# Patient Record
Sex: Male | Born: 1950 | Race: Black or African American | Hispanic: No | Smoking: Never smoker
Health system: Southern US, Community
[De-identification: ages and names within clinical notes are randomized; demographics above are authoritative.]

## PROBLEM LIST (undated history)

## (undated) DIAGNOSIS — K219 Gastro-esophageal reflux disease without esophagitis: Secondary | ICD-10-CM

## (undated) DIAGNOSIS — G4730 Sleep apnea, unspecified: Secondary | ICD-10-CM

## (undated) DIAGNOSIS — M1990 Unspecified osteoarthritis, unspecified site: Secondary | ICD-10-CM

## (undated) DIAGNOSIS — E119 Type 2 diabetes mellitus without complications: Secondary | ICD-10-CM

## (undated) DIAGNOSIS — M9979 Connective tissue and disc stenosis of intervertebral foramina of abdomen and other regions: Secondary | ICD-10-CM

## (undated) DIAGNOSIS — I10 Essential (primary) hypertension: Secondary | ICD-10-CM

## (undated) HISTORY — DX: Unspecified osteoarthritis, unspecified site: M19.90

## (undated) HISTORY — DX: Gastro-esophageal reflux disease without esophagitis: K21.9

## (undated) HISTORY — PX: OTHER SURGICAL HISTORY: SHX169

---

## 2008-04-15 ENCOUNTER — Emergency Department: Payer: Self-pay | Admitting: Emergency Medicine

## 2008-11-03 ENCOUNTER — Emergency Department: Payer: Self-pay | Admitting: Emergency Medicine

## 2011-08-27 ENCOUNTER — Emergency Department (HOSPITAL_COMMUNITY)
Admission: EM | Admit: 2011-08-27 | Discharge: 2011-08-27 | Disposition: A | Discharge status: Home / Self Care | Attending: Emergency Medicine | Admitting: Emergency Medicine

## 2011-08-27 ENCOUNTER — Encounter (HOSPITAL_COMMUNITY): Payer: Self-pay | Admitting: Emergency Medicine

## 2011-08-27 DIAGNOSIS — G4730 Sleep apnea, unspecified: Secondary | ICD-10-CM | POA: Insufficient documentation

## 2011-08-27 DIAGNOSIS — T7840XA Allergy, unspecified, initial encounter: Secondary | ICD-10-CM

## 2011-08-27 DIAGNOSIS — R221 Localized swelling, mass and lump, neck: Secondary | ICD-10-CM | POA: Insufficient documentation

## 2011-08-27 DIAGNOSIS — R220 Localized swelling, mass and lump, head: Secondary | ICD-10-CM | POA: Insufficient documentation

## 2011-08-27 DIAGNOSIS — X58XXXA Exposure to other specified factors, initial encounter: Secondary | ICD-10-CM | POA: Insufficient documentation

## 2011-08-27 DIAGNOSIS — Z79899 Other long term (current) drug therapy: Secondary | ICD-10-CM | POA: Insufficient documentation

## 2011-08-27 DIAGNOSIS — I10 Essential (primary) hypertension: Secondary | ICD-10-CM | POA: Insufficient documentation

## 2011-08-27 DIAGNOSIS — T783XXA Angioneurotic edema, initial encounter: Secondary | ICD-10-CM | POA: Insufficient documentation

## 2011-08-27 HISTORY — DX: Essential (primary) hypertension: I10

## 2011-08-27 HISTORY — DX: Sleep apnea, unspecified: G47.30

## 2011-08-27 MED ORDER — METHYLPREDNISOLONE SODIUM SUCC 125 MG IJ SOLR
125.0000 mg | Freq: Once | INTRAMUSCULAR | Status: AC
  Administered 2011-08-27: 125 mg via INTRAVENOUS
  Filled 2011-08-27: qty 2

## 2011-08-27 MED ORDER — PREDNISONE 10 MG PO TABS: 20.0000 mg | ORAL_TABLET | Freq: Two times a day (BID) | ORAL | Status: DC

## 2011-08-27 MED ORDER — FAMOTIDINE IN NACL 20-0.9 MG/50ML-% IV SOLN
20.0000 mg | Freq: Once | INTRAVENOUS | Status: AC
  Administered 2011-08-27: 20 mg via INTRAVENOUS
  Filled 2011-08-27: qty 50

## 2011-08-27 MED ORDER — DIPHENHYDRAMINE HCL 50 MG/ML IJ SOLN
25.0000 mg | Freq: Once | INTRAMUSCULAR | Status: AC
  Administered 2011-08-27: 25 mg via INTRAVENOUS
  Filled 2011-08-27 (×2): qty 1

## 2011-08-27 NOTE — Discharge Instructions (Signed)
Prednisone as prescribed for the next three days.  Benadryl 25 mg every six hours for the next three days.  Stop your Fosinopril.  Follow up with your doctor in one week.      Angioedema Angioedema (AE) is sudden puffiness (swelling) of the skin. It can happen to the face, genitals, and other body parts. You may be reacting to something you are sensitive to (allergic reaction). It may have been passed to you from your parents (hereditary), or it may develop by itself (acquired). You may also get red itchy patches of skin (hives). Attacks may be mild. Some attacks are life-threatening. Most often, the puffiness happens fast. It often gets better in 24 to 48 hours.  HOME CARE  Carry your emergency allergy medicines with you.   Wear a medical bracelet.   Avoid things that you know will cause this reaction (triggers).  GET HELP RIGHT AWAY IF:   You have trouble breathing.   You have trouble swallowing.   You pass out (faint).   You have another attack.   Your attacks happen more often or get worse.   AE was passed to you by your parents and you want to start having children.  MAKE SURE YOU:   Understand these instructions.   Will watch your condition.   Will get help right away if you are not doing well or get worse.  Document Released: 06/13/2009 Document Revised: 03/07/2011 Document Reviewed: 06/13/2009 [facility] Patient Information 2012 [address], [place].

## 2011-08-27 NOTE — ED Provider Notes (Signed)
History     CSN: [id number]  Arrival date & time 08/27/11  1523   First MD Initiated Contact with Patient 08/27/11 1615      Chief Complaint  Patient presents with  . Allergic Reaction    (Consider location/radiation/quality/duration/timing/severity/associated sxs/prior treatment) HPI History by the patient.  61 year old male presenting with complaint of lip swelling. Patient's symptoms began gradually around 1:30 to 2 PM today. Patient states that this was one to 2 hours after eating some seafood and coffee. Patient reports swelling location in the left lower lip without associated tong, throat, or other facial swelling. Patient denies associated bruits change, difficulty swallowing, breathing, rash, or nausea vomiting. Patient has no similar symptoms in the past. Patient has not been seen recently for this or other complaints. Patient does take fosinopril, which he has been on for approximately 6 months with our prior difficulties. Patient was previously on lisinopril but discontinued this secondary to "feeling like he was losing his mind."  Patient did take Benadryl 25 mg his PCP recommendation prior to ED arrival without appreciated change in the symptoms.   Past Medical History  Diagnosis Date  . Hypertension   . Sleep apnea     Past Surgical History  Procedure Date  . Uvula removal     No family history on file. including no known FH of angioedema.  History  Substance Use Topics  . Smoking status: Never Smoker   . Smokeless tobacco: Not on file  . Alcohol Use: No      Review of Systems  Constitutional: Negative for fever and chills.  HENT: Positive for facial swelling. Negative for congestion, sore throat and rhinorrhea.   Eyes: Negative for pain and visual disturbance.  Respiratory: Negative for cough and shortness of breath.   Cardiovascular: Negative for chest pain and palpitations.  Gastrointestinal: Negative for nausea, vomiting, abdominal pain and  diarrhea.  Genitourinary: Negative for dysuria and hematuria.  Musculoskeletal: Negative for back pain and gait problem.  Skin: Negative for rash and wound.  Neurological: Positive for headaches (mild, generalized, worse with light). Negative for dizziness.  Psychiatric/Behavioral: Negative for confusion and agitation.  All other systems reviewed and are negative.    Allergies  Contrast media and Lisinopril  Home Medications   Current Outpatient Rx  Name Route Sig Dispense Refill  . AMLODIPINE BESYLATE 5 MG PO TABS Oral Take 5 mg by mouth daily.    . ASPIRIN EC 81 MG PO TBEC Oral Take 81 mg by mouth daily.    . ATENOLOL 25 MG PO TABS Oral Take 25 mg by mouth daily.    Marland Kitchen DIPHENHYDRAMINE HCL 25 MG PO TABS Oral Take 25 mg by mouth daily as needed. For allergic reaction    . FOSINOPRIL SODIUM 10 MG PO TABS Oral Take 5 mg by mouth daily.    Marland Kitchen OMEPRAZOLE 20 MG PO CPDR Oral Take 20 mg by mouth daily.    Marland Kitchen OVER THE COUNTER MEDICATION Oral Take 2 tablets by mouth 2 (two) times daily. Green Coffee Bean Vitamin    . OVER THE COUNTER MEDICATION Oral Take 2 tablets by mouth 2 (two) times daily. Prostate 5LX    . OVER THE COUNTER MEDICATION Oral Take 1 tablet by mouth daily. Omega XL supplement    . OVER THE COUNTER MEDICATION Oral Take 1 tablet by mouth daily. Male Libido Supplement    . SIMVASTATIN 10 MG PO TABS Oral Take 5 mg by mouth at bedtime.    Marland Kitchen  AZITHROMYCIN 250 MG PO TABS Oral Take 250 mg by mouth daily. Started on 2/13, finished on 2/18      BP 115/71  Pulse 56  Temp(Src) 98 F (36.7 C) (Oral)  Resp 14  SpO2 100%  Physical Exam  Nursing note and vitals reviewed. Constitutional: He is oriented to person, place, and time. No distress.       Min overweight, alert; NL voice/speech; no difficulty swallowing secretions.  HENT:  Head: Normocephalic and atraumatic.  Right Ear: External ear normal.  Left Ear: External ear normal.  Nose: Nose normal.       Lt lower lip edema lateral  to midline; no Sn of tongue or posterior oropharynx edema; absent uvula (with reportedly h/o removal 2/2 OSA).  Eyes: Conjunctivae and EOM are normal. Pupils are equal, round, and reactive to light.  Neck: Normal range of motion. Neck supple.  Cardiovascular: Normal rate, regular rhythm and intact distal pulses.   No murmur heard. Pulmonary/Chest: Effort normal and breath sounds normal. No respiratory distress. He has no wheezes.  Abdominal: Soft. Bowel sounds are normal. He exhibits no distension. There is no tenderness.  Musculoskeletal: Normal range of motion. He exhibits no edema.  Neurological: He is alert and oriented to person, place, and time.  Skin: Skin is warm and dry. No rash noted. He is not diaphoretic.  Psychiatric: He has a normal mood and affect. Judgment normal.    ED Course  Procedures (including critical care time)  Labs Reviewed - No data to display No results found.   1. Allergic reaction   2. Angioedema of lips       MDM  61 year old male presenting with complaints of lip swelling. Patient is taking an ACE inhibitor (x 6 months) without prior similar symptoms in the past.  No other swelling or difficulty breathing.  Exam is above, edema to left lateral lower lip without additional edema to patient's airway. No abnormal bruits or wheezing. Suspect angioedema with possible source of ACE inhibitor. Patient took 25 mg of Benadryl prior to ED arrival.  Plan to observe and to administer steroids, and additional Benadryl, and an H1 blocker.  Patient observed and without worsening of his symptoms. We'll discharge Mr. return precautions and to be 3 days of scheduled Benadryl, steroids, and H1 blockers.      Particia Lather, MD 08/28/11 [phone]

## 2011-08-27 NOTE — ED Notes (Signed)
Pt c/o left lower lip swelling onset approx 1 hr. Ago, pt st's he ate broiled fish at 1:30 today but has never been allergic to fish before.  No resp. Problems noted at present, no rash.

## 2011-08-27 NOTE — ED Notes (Signed)
Pt c/o left lower lip swelling beginning at 1:30. Pt states he thought his lip felt weird and saw it and had a "big blister" on left side. He does not believe that the area has grown since he has seen it. Denies issues swallowing or swelling in throat or tongue. States "my eyes feel weird."

## 2011-08-28 NOTE — ED Provider Notes (Signed)
I saw and evaluated the patient, reviewed the resident's note and I agree with the findings and plan.  I saw the patient along with Dr. Lendell Caprice.  I agree with her assessment, note, and plan.  The patient presented complaining of swelling of the left lower lip.  He is on an ace inhibitor, but denies any other new exposures.  On exam, there is marked swelling of the right side of the lower lip, but does not appear to be any swelling of the throat.  The heart and lung exam was normal, and there was no stridor or respiratory compromise.  He was given IV steroids and benadryl and was observed for two hours.  The swelling improved somewhat.  He will be discharged on prednisone, benadryl, and instructed to stop taking his fosinopril.  He was advised to return if he worsens.  Geoffery Lyons, MD 08/28/11 [phone]

## 2011-10-30 ENCOUNTER — Encounter (HOSPITAL_COMMUNITY): Payer: Self-pay | Admitting: *Deleted

## 2011-10-30 ENCOUNTER — Emergency Department (HOSPITAL_COMMUNITY)
Admission: EM | Admit: 2011-10-30 | Discharge: 2011-10-30 | Disposition: A | Discharge status: Home / Self Care | Attending: Emergency Medicine | Admitting: Emergency Medicine

## 2011-10-30 ENCOUNTER — Emergency Department (HOSPITAL_COMMUNITY)

## 2011-10-30 DIAGNOSIS — I10 Essential (primary) hypertension: Secondary | ICD-10-CM | POA: Insufficient documentation

## 2011-10-30 DIAGNOSIS — R209 Unspecified disturbances of skin sensation: Secondary | ICD-10-CM | POA: Insufficient documentation

## 2011-10-30 DIAGNOSIS — R42 Dizziness and giddiness: Secondary | ICD-10-CM | POA: Insufficient documentation

## 2011-10-30 DIAGNOSIS — G4730 Sleep apnea, unspecified: Secondary | ICD-10-CM | POA: Insufficient documentation

## 2011-10-30 DIAGNOSIS — R51 Headache: Secondary | ICD-10-CM

## 2011-10-30 LAB — COMPREHENSIVE METABOLIC PANEL
ALT: 24 U/L (ref 0–53)
AST: 24 U/L (ref 0–37)
Albumin: 4.3 g/dL (ref 3.5–5.2)
Alkaline Phosphatase: 95 U/L (ref 39–117)
Chloride: 100 mEq/L (ref 96–112)
Potassium: 3.9 mEq/L (ref 3.5–5.1)
Sodium: 139 mEq/L (ref 135–145)
Total Bilirubin: 0.5 mg/dL (ref 0.3–1.2)
Total Protein: 7.4 g/dL (ref 6.0–8.3)

## 2011-10-30 LAB — DIFFERENTIAL
Basophils Absolute: 0 10*3/uL (ref 0.0–0.1)
Basophils Relative: 1 % (ref 0–1)
Eosinophils Absolute: 0.1 10*3/uL (ref 0.0–0.7)
Monocytes Relative: 6 % (ref 3–12)
Neutro Abs: 1.8 10*3/uL (ref 1.7–7.7)
Neutrophils Relative %: 43 % (ref 43–77)

## 2011-10-30 LAB — CBC
Hemoglobin: 15.4 g/dL (ref 13.0–17.0)
MCH: 29.4 pg (ref 26.0–34.0)
MCHC: 34.3 g/dL (ref 30.0–36.0)
Platelets: 166 10*3/uL (ref 150–400)

## 2011-10-30 LAB — CK TOTAL AND CKMB (NOT AT ARMC): Total CK: 389 U/L — ABNORMAL HIGH (ref 7–232)

## 2011-10-30 LAB — TROPONIN I: Troponin I: <0.3 ng/mL (ref <0.30)

## 2011-10-30 NOTE — ED Notes (Signed)
The pt has had a numbness and tingling in the rt side of his face and rt head since yesterday.  He now has a headache today.  He still has some tingling rt face and head with dizziness

## 2011-10-30 NOTE — ED Provider Notes (Signed)
History   This chart was scribed for Micheal Co, MD by Clarita Crane. The patient was seen in room STRE2/STRE2. Patient's care was started at 1643.    CSN: [id number]  Arrival date & time 10/30/11  1643   None     Chief Complaint  Patient presents with  . tingling rt face     HPI Micheal Wood is a 61 y.o. male who presents to the Emergency Department complaining of intermittent episodes of dizziness described as disequilibrium onset this morning and persistent since although gradually improving with associated intermittent HAs. Patient also notes he had a brief episode yesterday in which the right side of his face became suddenly "disfigured" which he describes as a facial droop but upwards. Patient states this episode lasted 10 seconds before resolving on its own.  Additionally, patient relates that he had a 3-5 minute episode yesterday following facial "disfiguring" in which his left hand contracted but the contraction resolved on its own. Denies numbness, tingling, weakness, nausea, vomiting, chest pain, SOB, fever chills. Patient with h/o HTN and sleep apnea.   Past Medical History  Diagnosis Date  . Hypertension   . Sleep apnea     Past Surgical History  Procedure Date  . U     No family history on file.  History  Substance Use Topics  . Smoking status: Never Smoker   . Smokeless tobacco: Not on file  . Alcohol Use: No      Review of Systems  Constitutional: Negative for fever and chills.  HENT:       Right sided facial "disfiguring"  Respiratory: Negative for shortness of breath.   Cardiovascular: Negative for chest pain.  Gastrointestinal: Negative for nausea and vomiting.  Musculoskeletal:       Left hand contraction.   Neurological: Positive for dizziness and headaches. Negative for tremors, weakness and numbness.  All other systems reviewed and are negative.    Allergies  Contrast media; Fosinopril; and Lisinopril  Home Medications   Current  Outpatient Rx  Name Route Sig Dispense Refill  . AMLODIPINE BESYLATE 5 MG PO TABS Oral Take 5 mg by mouth daily.    . ASPIRIN EC 81 MG PO TBEC Oral Take 81 mg by mouth daily.    . ATENOLOL 25 MG PO TABS Oral Take 25 mg by mouth daily.    Marland Kitchen OMEPRAZOLE 20 MG PO CPDR Oral Take 20 mg by mouth daily.    Marland Kitchen OVER THE COUNTER MEDICATION Oral Take 2 tablets by mouth 2 (two) times daily. Green Coffee Bean Vitamin    . OVER THE COUNTER MEDICATION Oral Take 2 tablets by mouth 2 (two) times daily. Prostate 5LX    . OVER THE COUNTER MEDICATION Oral Take 1 tablet by mouth daily. Omega-3 XL supplement    . OVER THE COUNTER MEDICATION Oral Take 1 tablet by mouth daily. Male Libido Supplement    . SIMVASTATIN 10 MG PO TABS Oral Take 5 mg by mouth at bedtime.      BP 126/77  Pulse 66  Temp 97.6 F (36.4 C)  Resp 18  SpO2 98%  Physical Exam  Nursing note and vitals reviewed. Constitutional: He is oriented to person, place, and time. He appears well-developed and well-nourished. No distress.  HENT:  Head: Normocephalic and atraumatic.  Eyes: EOM are normal. Pupils are equal, round, and reactive to light.  Neck: Neck supple. No tracheal deviation present.  Cardiovascular: Normal rate.   Pulmonary/Chest: Effort normal. No respiratory  distress.  Abdominal: Soft. He exhibits no distension.  Musculoskeletal: Normal range of motion. He exhibits no edema.  Neurological: He is alert and oriented to person, place, and time. No sensory deficit.       Upper extremity strength normal and equal bilaterally. No pronator drift noted. Normal finger to nose bilaterally. No facial droop, face is symmetric. Lower extremity strength normal and equal bilaterally.   Skin: Skin is warm and dry.  Psychiatric: He has a normal mood and affect. His behavior is normal.    ED Course  Procedures (including critical care time)  DIAGNOSTIC STUDIES: Oxygen Saturation is 98% on room air, normal by my interpretation.     COORDINATION OF CARE: 6:51PM-Patient informed of current plan for treatment and evaluation and agrees with plan at this time.     Labs Reviewed  DIFFERENTIAL - Abnormal; Notable for the following:    Lymphocytes Relative 47 (*)    All other components within normal limits  COMPREHENSIVE METABOLIC PANEL - Abnormal; Notable for the following:    Creatinine, Ser 1.43 (*)    GFR calc non Af Amer 51 (*)    GFR calc Af Amer 60 (*)    All other components within normal limits  CK TOTAL AND CKMB - Abnormal; Notable for the following:    Total CK 389 (*)    All other components within normal limits  CBC  TROPONIN I   Ct Head Wo Contrast  10/30/2011  *RADIOLOGY REPORT*  Clinical Data:  Headache and dizziness.  Right facial numbness.  CT HEAD WITHOUT CONTRAST  Technique: Contiguous axial images were obtained from the base of the skull through the vertex without contrast  Comparison: None.  Findings:  There is no evidence of intracranial hemorrhage, brain edema, or other signs of acute infarction.  There is no evidence of intracranial mass lesion or mass effect.  No abnormal extraaxial fluid collections are identified.  There is no evidence of hydrocephalus, or other significant intracranial abnormality.  No skull abnormality identified.  IMPRESSION: Negative non-contrast head CT.  Original Report Authenticated By: Danae Orleans, M.D.     1. Headache       MDM  The patient's symptoms do not sound like a stroke.  Unclear why the patient had transient 10-15 second symptoms of his right face.  It doesn't sound like a facial droop.  At this time is at a normal neurologic exam.  He'll continue his 81 mg aspirin and followup with his physicians.  I doubly he needs an MRI.  I don't believe that this truly needs a TIA workup.      I personally performed the services described in this documentation, which was scribed in my presence. The recorded information has been reviewed and considered.       Micheal Co, MD 10/30/11 [phone]

## 2011-10-30 NOTE — ED Notes (Signed)
The pts face is  Symmetrel and he has no arm drift

## 2011-10-30 NOTE — ED Notes (Signed)
Pt st's he had right sided headache with tingling earlier today.  Pt st's no tingling at this time only has headache at top of head.  Pt alert and oriented x's 3.  Skin warm and dry, color appropriate.

## 2013-06-26 ENCOUNTER — Encounter (HOSPITAL_COMMUNITY): Payer: Self-pay | Admitting: Emergency Medicine

## 2013-06-26 ENCOUNTER — Emergency Department (HOSPITAL_COMMUNITY)
Admission: EM | Admit: 2013-06-26 | Discharge: 2013-06-26 | Disposition: A | Discharge status: Home / Self Care | Attending: Emergency Medicine | Admitting: Emergency Medicine

## 2013-06-26 DIAGNOSIS — Z7982 Long term (current) use of aspirin: Secondary | ICD-10-CM | POA: Insufficient documentation

## 2013-06-26 DIAGNOSIS — Z79899 Other long term (current) drug therapy: Secondary | ICD-10-CM | POA: Insufficient documentation

## 2013-06-26 DIAGNOSIS — I10 Essential (primary) hypertension: Secondary | ICD-10-CM | POA: Insufficient documentation

## 2013-06-26 DIAGNOSIS — R51 Headache: Secondary | ICD-10-CM | POA: Insufficient documentation

## 2013-06-26 DIAGNOSIS — Z8739 Personal history of other diseases of the musculoskeletal system and connective tissue: Secondary | ICD-10-CM | POA: Insufficient documentation

## 2013-06-26 HISTORY — DX: Connective tissue and disc stenosis of intervertebral foramina of abdomen and other regions: M99.79

## 2013-06-26 MED ORDER — HYDROCODONE-ACETAMINOPHEN 5-325 MG PO TABS
1.0000 | ORAL_TABLET | Freq: Four times a day (QID) | ORAL | Status: DC | PRN
Start: 2013-06-26 — End: 2015-03-16

## 2013-06-26 MED ORDER — IBUPROFEN 600 MG PO TABS: 600.0000 mg | ORAL_TABLET | Freq: Four times a day (QID) | ORAL | Status: DC | PRN

## 2013-06-26 MED ORDER — METOCLOPRAMIDE HCL 5 MG/ML IJ SOLN
10.0000 mg | Freq: Once | INTRAMUSCULAR | Status: AC
  Administered 2013-06-26: 10 mg via INTRAVENOUS
  Filled 2013-06-26: qty 2

## 2013-06-26 MED ORDER — SODIUM CHLORIDE 0.9 % IV BOLUS (SEPSIS)
1000.0000 mL | Freq: Once | INTRAVENOUS | Status: AC
  Administered 2013-06-26: 1000 mL via INTRAVENOUS

## 2013-06-26 MED ORDER — KETOROLAC TROMETHAMINE 30 MG/ML IJ SOLN
30.0000 mg | Freq: Once | INTRAMUSCULAR | Status: AC
  Administered 2013-06-26: 30 mg via INTRAVENOUS
  Filled 2013-06-26: qty 1

## 2013-06-26 NOTE — ED Notes (Signed)
Alert, NAD, calm, interactive, IVF bolus complete.

## 2013-06-26 NOTE — ED Notes (Addendum)
Patient comes from home, says he started having a "headache" that starts in the back of his head to the top of his head.  The patient said the reason he came in is because he is afraid that with his high blood pressure and sleep apnea this might be a stroke.  GCS=15, he also advised that he has had "tingling" for a month in his right arm and he was told that it was due to narrowing of his vertebrae.  Pain is intermittent rated 8/10.  The patient also said he took benadryl for allergies at 2130 and viagra at 2230.

## 2013-06-26 NOTE — ED Notes (Signed)
Assumed patient care.  Pt A&O x 4.  States pain at base of skull radiates up back of skull and to the top.  Denies any other symptoms.

## 2013-06-26 NOTE — ED Provider Notes (Signed)
CSN: [id number]     Arrival date & time 06/26/13  8295 History   First MD Initiated Contact with Patient 06/26/13 0358     Chief Complaint  Patient presents with  . Headache   (Consider location/radiation/quality/duration/timing/severity/associated sxs/prior Treatment) HPI Comments: Pt comes in with cc of headaches. Pt has HTN hx. States that he woke up around 3:30 and noticed a headache - at the back of his head. The headache is constant, 4/10, with intermittent flare ups.  No nausea, vomiting, visual complains, seizures, altered mental status, loss of consciousness, new weakness, or numbness, no gait instability. No hx of similar headaches.   Patient is a 62 y.o. male presenting with headaches. The history is provided by the patient.  Headache Associated symptoms: no abdominal pain and no cough     Past Medical History  Diagnosis Date  . Hypertension   . Sleep apnea   . Narrowing of intervertebral disc space    Past Surgical History  Procedure Laterality Date  . U    . Uvula removed     History reviewed. No pertinent family history. History  Substance Use Topics  . Smoking status: Never Smoker   . Smokeless tobacco: Not on file  . Alcohol Use: No    Review of Systems  Constitutional: Negative for activity change and appetite change.  Respiratory: Negative for cough and shortness of breath.   Cardiovascular: Negative for chest pain.  Gastrointestinal: Negative for abdominal pain.  Genitourinary: Negative for dysuria.  Neurological: Positive for headaches.    Allergies  Contrast media; Fosinopril; and Lisinopril  Home Medications   Current Outpatient Rx  Name  Route  Sig  Dispense  Refill  . amLODipine (NORVASC) 5 MG tablet   Oral   Take 5 mg by mouth daily.         Marland Kitchen aspirin EC 81 MG tablet   Oral   Take 81 mg by mouth daily.         Marland Kitchen atenolol (TENORMIN) 25 MG tablet   Oral   Take 25 mg by mouth daily.         . diphenhydrAMINE (BENADRYL) 25 mg  capsule   Oral   Take 25 mg by mouth every 6 (six) hours as needed for allergies.         Marland Kitchen losartan (COZAAR) 50 MG tablet   Oral   Take 50 mg by mouth daily.         Marland Kitchen omeprazole (PRILOSEC) 20 MG capsule   Oral   Take 20 mg by mouth daily.         . Sildenafil Citrate (VIAGRA PO)   Oral   Take 1 tablet by mouth as needed (for erectile dysfunction).         Marland Kitchen ibuprofen (ADVIL,MOTRIN) 600 MG tablet   Oral   Take 1 tablet (600 mg total) by mouth every 6 (six) hours as needed.   30 tablet   0    BP 129/79  Pulse 56  Temp(Src) 98.1 F (36.7 C) (Oral)  Resp 16  Ht 5\' 6"  (1.676 m)  Wt 185 lb (83.915 kg)  BMI 29.87 kg/m2  SpO2 99% Physical Exam  Nursing note and vitals reviewed. Constitutional: He is oriented to person, place, and time. He appears well-developed.  HENT:  Head: Normocephalic and atraumatic.  Eyes: Conjunctivae and EOM are normal. Pupils are equal, round, and reactive to light.  Neck: Normal range of motion. Neck supple.  Cardiovascular: Normal rate  and regular rhythm.   Pulmonary/Chest: Effort normal and breath sounds normal.  Abdominal: Soft. Bowel sounds are normal. He exhibits no distension. There is no tenderness. There is no rebound and no guarding.  Neurological: He is alert and oriented to person, place, and time. No cranial nerve deficit. Coordination normal.  Skin: Skin is warm.    ED Course  Procedures (including critical care time) Labs Review Labs Reviewed - No data to display Imaging Review No results found.  EKG Interpretation   None       MDM   1. Headache    DDX includes: Primary headaches - including migrainous headaches, cluster headaches, tension headaches. ICH Carotid dissection Cavernous sinus thrombosis Meningitis Encephalitis Sinusitis Tumor Vascular headaches AV malformation Brain aneurysm Muscular headaches  A/P: Pt comes in with cc of headaches. No concerns for life threatening secondary  headaches because the headache is mild and there is no neuro deficit, and patient has no risk factors for brain infection, or vascular pathology.  Pain improved with headache cocktail - will d.c.    Derwood Kaplan, MD 06/26/13 [phone]

## 2014-12-24 ENCOUNTER — Encounter (HOSPITAL_BASED_OUTPATIENT_CLINIC_OR_DEPARTMENT_OTHER): Payer: Self-pay | Admitting: *Deleted

## 2014-12-24 ENCOUNTER — Emergency Department (HOSPITAL_BASED_OUTPATIENT_CLINIC_OR_DEPARTMENT_OTHER): Payer: No Typology Code available for payment source

## 2014-12-24 ENCOUNTER — Emergency Department (HOSPITAL_BASED_OUTPATIENT_CLINIC_OR_DEPARTMENT_OTHER)
Admission: EM | Admit: 2014-12-24 | Discharge: 2014-12-24 | Disposition: A | Discharge status: Home / Self Care | Payer: No Typology Code available for payment source | Attending: Emergency Medicine | Admitting: Emergency Medicine

## 2014-12-24 DIAGNOSIS — Y9389 Activity, other specified: Secondary | ICD-10-CM | POA: Diagnosis not present

## 2014-12-24 DIAGNOSIS — Z79899 Other long term (current) drug therapy: Secondary | ICD-10-CM | POA: Diagnosis not present

## 2014-12-24 DIAGNOSIS — Z7982 Long term (current) use of aspirin: Secondary | ICD-10-CM | POA: Insufficient documentation

## 2014-12-24 DIAGNOSIS — T148XXA Other injury of unspecified body region, initial encounter: Secondary | ICD-10-CM

## 2014-12-24 DIAGNOSIS — Y998 Other external cause status: Secondary | ICD-10-CM | POA: Diagnosis not present

## 2014-12-24 DIAGNOSIS — S39012A Strain of muscle, fascia and tendon of lower back, initial encounter: Secondary | ICD-10-CM | POA: Insufficient documentation

## 2014-12-24 DIAGNOSIS — Y92410 Unspecified street and highway as the place of occurrence of the external cause: Secondary | ICD-10-CM | POA: Diagnosis not present

## 2014-12-24 DIAGNOSIS — E119 Type 2 diabetes mellitus without complications: Secondary | ICD-10-CM | POA: Diagnosis not present

## 2014-12-24 DIAGNOSIS — S3992XA Unspecified injury of lower back, initial encounter: Secondary | ICD-10-CM | POA: Diagnosis present

## 2014-12-24 DIAGNOSIS — I10 Essential (primary) hypertension: Secondary | ICD-10-CM | POA: Insufficient documentation

## 2014-12-24 HISTORY — DX: Type 2 diabetes mellitus without complications: E11.9

## 2014-12-24 MED ORDER — CYCLOBENZAPRINE HCL 10 MG PO TABS: 10.0000 mg | ORAL_TABLET | Freq: Three times a day (TID) | ORAL | Status: DC | PRN
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
## 2014-12-24 NOTE — ED Notes (Signed)
Patient transported to X-ray via stretcher per tech. 
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
## 2015-01-20 ENCOUNTER — Other Ambulatory Visit: Payer: Self-pay | Admitting: Specialist

## 2015-01-20 DIAGNOSIS — M542 Cervicalgia: Secondary | ICD-10-CM

## 2015-01-29 ENCOUNTER — Ambulatory Visit
Admission: RE | Admit: 2015-01-29 | Discharge: 2015-01-29 | Disposition: A | Discharge status: Home / Self Care | Payer: Self-pay | Source: Ambulatory Visit | Attending: Specialist | Admitting: Specialist

## 2015-01-29 DIAGNOSIS — M542 Cervicalgia: Secondary | ICD-10-CM

## 2015-02-23 ENCOUNTER — Encounter: Payer: Self-pay | Admitting: *Deleted

## 2015-02-25 ENCOUNTER — Ambulatory Visit: Payer: Non-veteran care | Admitting: Diagnostic Neuroimaging

## 2015-03-15 ENCOUNTER — Encounter: Payer: Self-pay | Admitting: *Deleted

## 2015-03-16 ENCOUNTER — Ambulatory Visit (INDEPENDENT_AMBULATORY_CARE_PROVIDER_SITE_OTHER): Admitting: Diagnostic Neuroimaging

## 2015-03-16 ENCOUNTER — Encounter: Payer: Self-pay | Admitting: Diagnostic Neuroimaging

## 2015-03-16 VITALS — BP 137/94 | HR 60 | Ht 67.0 in | Wt 195.0 lb

## 2015-03-16 DIAGNOSIS — M4802 Spinal stenosis, cervical region: Secondary | ICD-10-CM | POA: Diagnosis not present

## 2015-03-16 DIAGNOSIS — M5010 Cervical disc disorder with radiculopathy, unspecified cervical region: Secondary | ICD-10-CM | POA: Diagnosis not present

## 2015-03-16 DIAGNOSIS — G959 Disease of spinal cord, unspecified: Secondary | ICD-10-CM | POA: Diagnosis not present
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
## 2015-03-16 NOTE — Patient Instructions (Signed)
Monitor symptoms. 

## 2015-03-17 ENCOUNTER — Telehealth: Payer: Self-pay | Admitting: *Deleted

## 2015-03-17 LAB — VITAMIN B12: VITAMIN B 12: 931 pg/mL (ref 211–946)

## 2015-03-17 NOTE — Telephone Encounter (Signed)
Spoke with patient and informed him that his Vit B12 lab result is normal. He verbalized understanding, appreciation.

## 2015-03-17 NOTE — Telephone Encounter (Signed)
-----   Message from Suanne Marker, MD sent at 03/17/2015  8:29 AM EDT ----- pls call with normal results. -VRP

## 2015-06-16 ENCOUNTER — Other Ambulatory Visit: Payer: Self-pay | Admitting: Specialist

## 2015-06-16 DIAGNOSIS — M545 Low back pain: Secondary | ICD-10-CM

## 2015-06-29 ENCOUNTER — Ambulatory Visit
Admission: RE | Admit: 2015-06-29 | Discharge: 2015-06-29 | Disposition: A | Discharge status: Home / Self Care | Source: Ambulatory Visit | Attending: Specialist | Admitting: Specialist

## 2015-06-29 DIAGNOSIS — M545 Low back pain: Secondary | ICD-10-CM

## 2016-03-03 ENCOUNTER — Encounter (HOSPITAL_COMMUNITY): Payer: Self-pay

## 2016-03-03 ENCOUNTER — Emergency Department (HOSPITAL_COMMUNITY)
Admission: EM | Admit: 2016-03-03 | Discharge: 2016-03-03 | Disposition: A | Discharge status: Home / Self Care | Payer: Medicare Other | Attending: Emergency Medicine | Admitting: Emergency Medicine

## 2016-03-03 ENCOUNTER — Emergency Department (HOSPITAL_COMMUNITY): Payer: Medicare Other

## 2016-03-03 DIAGNOSIS — Z79899 Other long term (current) drug therapy: Secondary | ICD-10-CM | POA: Insufficient documentation

## 2016-03-03 DIAGNOSIS — Z7982 Long term (current) use of aspirin: Secondary | ICD-10-CM | POA: Insufficient documentation

## 2016-03-03 DIAGNOSIS — F0781 Postconcussional syndrome: Secondary | ICD-10-CM | POA: Diagnosis not present

## 2016-03-03 DIAGNOSIS — E119 Type 2 diabetes mellitus without complications: Secondary | ICD-10-CM | POA: Diagnosis not present

## 2016-03-03 DIAGNOSIS — I10 Essential (primary) hypertension: Secondary | ICD-10-CM | POA: Diagnosis not present

## 2016-03-03 DIAGNOSIS — R51 Headache: Secondary | ICD-10-CM | POA: Diagnosis present

## 2016-03-03 DIAGNOSIS — G44309 Post-traumatic headache, unspecified, not intractable: Secondary | ICD-10-CM | POA: Insufficient documentation
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
## 2016-03-03 NOTE — Discharge Instructions (Signed)
Do not participate in any sports or any activities that could result in head trauma until you are cleared by your pediatrician,  primary care physician or neurologist.   Please follow with your primary care doctor in the next 2 days for a check-up. They must obtain records for further management.   Do not hesitate to return to the Emergency Department for any new, worsening or concerning symptoms.   

## 2016-03-03 NOTE — ED Notes (Signed)
Patient Alert and oriented X4. Stable and ambulatory. Patient verbalized understanding of the discharge instructions.  Patient belongings were taken by the patient.  
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
## 2016-03-17 ENCOUNTER — Other Ambulatory Visit: Payer: Self-pay | Admitting: Specialist

## 2016-03-17 DIAGNOSIS — M542 Cervicalgia: Secondary | ICD-10-CM

## 2016-03-17 DIAGNOSIS — M4802 Spinal stenosis, cervical region: Secondary | ICD-10-CM

## 2016-03-24 ENCOUNTER — Ambulatory Visit
Admission: RE | Admit: 2016-03-24 | Discharge: 2016-03-24 | Disposition: A | Discharge status: Home / Self Care | Payer: Medicare Other | Source: Ambulatory Visit | Attending: Specialist | Admitting: Specialist

## 2016-03-24 DIAGNOSIS — M4802 Spinal stenosis, cervical region: Secondary | ICD-10-CM

## 2016-03-24 DIAGNOSIS — M542 Cervicalgia: Secondary | ICD-10-CM

## 2016-10-18 ENCOUNTER — Ambulatory Visit (INDEPENDENT_AMBULATORY_CARE_PROVIDER_SITE_OTHER): Payer: Medicare Other

## 2016-10-18 ENCOUNTER — Ambulatory Visit (INDEPENDENT_AMBULATORY_CARE_PROVIDER_SITE_OTHER): Payer: Medicare Other | Admitting: Specialist

## 2016-10-18 ENCOUNTER — Encounter (INDEPENDENT_AMBULATORY_CARE_PROVIDER_SITE_OTHER): Payer: Self-pay | Admitting: Specialist

## 2016-10-18 ENCOUNTER — Telehealth (INDEPENDENT_AMBULATORY_CARE_PROVIDER_SITE_OTHER): Payer: Self-pay | Admitting: Radiology

## 2016-10-18 VITALS — BP 119/77 | HR 58 | Ht 67.0 in | Wt 177.0 lb

## 2016-10-18 DIAGNOSIS — M48062 Spinal stenosis, lumbar region with neurogenic claudication: Secondary | ICD-10-CM

## 2016-10-18 DIAGNOSIS — M542 Cervicalgia: Secondary | ICD-10-CM

## 2016-10-18 DIAGNOSIS — M4322 Fusion of spine, cervical region: Secondary | ICD-10-CM | POA: Diagnosis not present

## 2016-10-18 NOTE — Telephone Encounter (Signed)
Called patient to inform CD is ready for pickup with images from office visit today.
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
## 2016-11-02 ENCOUNTER — Ambulatory Visit
Admission: RE | Admit: 2016-11-02 | Discharge: 2016-11-02 | Disposition: A | Discharge status: Home / Self Care | Payer: Medicare Other | Source: Ambulatory Visit | Attending: Specialist | Admitting: Specialist

## 2016-11-02 DIAGNOSIS — M48062 Spinal stenosis, lumbar region with neurogenic claudication: Secondary | ICD-10-CM

## 2016-11-22 ENCOUNTER — Encounter (INDEPENDENT_AMBULATORY_CARE_PROVIDER_SITE_OTHER): Payer: Self-pay | Admitting: Specialist

## 2016-11-22 ENCOUNTER — Ambulatory Visit (INDEPENDENT_AMBULATORY_CARE_PROVIDER_SITE_OTHER): Payer: Medicare Other | Admitting: Specialist

## 2016-11-22 VITALS — BP 121/71 | HR 53 | Ht 67.0 in | Wt 188.0 lb

## 2016-11-22 DIAGNOSIS — M48062 Spinal stenosis, lumbar region with neurogenic claudication: Secondary | ICD-10-CM | POA: Diagnosis not present

## 2016-11-22 DIAGNOSIS — M5136 Other intervertebral disc degeneration, lumbar region: Secondary | ICD-10-CM

## 2016-11-22 NOTE — Patient Instructions (Addendum)
Plan: Avoid bending, stooping and avoid lifting weights greater than 10 lbs. Avoid prolong standing and walking. Avoid frequent bending and stooping  No lifting greater than 10 lbs. May use ice or moist heat for pain. Weight loss is of benefit. Handicap license is approved. Dr. Newton's secretary/Assistant will call to arrange for epidural steroid injection  

## 2016-11-22 NOTE — Progress Notes (Signed)
Office Visit Note   Patient: Micheal PulseLarry Christensen           Date of Birth: 06/12/1951           MRN: [id number] Visit Date: 11/22/2016              Requested by: No referring provider defined for this encounter. PCP: Default, Provider, MD   Assessment & Plan: Visit Diagnoses:  1. Spinal stenosis of lumbar region with neurogenic claudication   2. Degenerative disc disease, lumbar   Mr. Micheal Wood is 6 months following cervical decomp[facility] by Dr. Dimas MillinBranch C3 to C6 ACDF with Titanium cages and plate and screws. He is participating in a therapy program. Still complaining of ongoing low back pain with neurogenic claudication. [place] able to walk a considerable amount though with discomfort. Underwent recent MRI which demonstrates Severe lumbar spinal stenosis L4-5 with a central and right sided HNP, also biforamenal stenosis L5-S1. I reviewed his studies both report and the images with him. He would like to consider ESIs before any further surgery which is understandable. As the MRI findings are significant I explained that the ESI may only temporize the discomfort but that intervention can be done when he is at a point where he is Not able to function well. He seems to be able to function at a high level presently. ESIs scheduled with return in 1-2 months for follow up.   Plan:Avoid bending, stooping and avoid lifting weights greater than 10 lbs. Avoid prolong standing and walking. Avoid frequent bending and stooping  No lifting greater than 10 lbs. May use ice or moist heat for pain. Weight loss is of benefit. Handicap license is approved.  Dr. [place] BlasNewton's secretary/Assistant will call to arrange for epidural steroid injection    Follow-Up Instructions: No Follow-up on file.   Orders:  No orders of the defined types were placed in this encounter.  No orders of the defined types were placed in this encounter.      Procedures: No procedures performed   Clinical Data: No additional findings.   Subjective: Chief Complaint  Patient presents with  . Lower Back - Follow-up    MRI Review    HPI  Review of Systems   Objective: Vital Signs: BP 121/71 (BP Location: Left Arm, Patient Position: Sitting)   Wood (!) 53   Ht 5\' 7"  (1.702 m)   Wt 188 lb (85.3 kg)   BMI 29.44 kg/m   Physical Exam  Ortho Exam  Specialty Comments:  No specialty comments available.  Imaging: No results found.   PMFS History: There are no active problems to display for this patient.  Past Medical History:  Diagnosis Date  . Arthritis    DDD  . Diabetes mellitus without complication (HCC)   . GERD (gastroesophageal reflux disease)   . Hypertension   . Narrowing of intervertebral disc space   . Sleep apnea    CPAP    Family History  Problem Relation Age of Onset  . Hypertension Mother   . Ulcers Mother   . Hypertension Brother   . Cancer Brother   . Cancer Sister   . Stroke Paternal Grandmother     Past Surgical History:  Procedure Laterality Date  . u     removal of cysts x 3, chest x 2, wrists  . uvula removed     Social History   Occupational History  . retired  [employer], 20+ years   Social History Main Topics  . Smoking status: Never Smoker  . Smokeless tobacco: Never Used  . Alcohol use No  . Drug use: No  . Sexual activity: Not on file

## 2016-11-28 ENCOUNTER — Telehealth (INDEPENDENT_AMBULATORY_CARE_PROVIDER_SITE_OTHER): Payer: Self-pay | Admitting: Specialist

## 2016-11-28 ENCOUNTER — Telehealth (INDEPENDENT_AMBULATORY_CARE_PROVIDER_SITE_OTHER): Payer: Self-pay

## 2016-11-28 NOTE — Telephone Encounter (Signed)
Patient called wanting to know if he could get a note with his general restrictions for work either emailed or for him to pick up. CB # [phone]

## 2016-11-28 NOTE — Telephone Encounter (Signed)
I called and advised pt that his OV was ready for pick up and there are a list of restrictions that on the note 

## 2016-11-28 NOTE — Telephone Encounter (Signed)
Patient would like to know if office note can be emailed to him?   If not, stated that he will come and pick note up.  Thank You

## 2016-11-28 NOTE — Telephone Encounter (Signed)
I called and advised pt that his OV was ready for pick up and there are a list of restrictions that on the note

## 2016-12-05 ENCOUNTER — Telehealth (INDEPENDENT_AMBULATORY_CARE_PROVIDER_SITE_OTHER): Payer: Self-pay | Admitting: *Deleted

## 2016-12-05 NOTE — Telephone Encounter (Signed)
Patient called in this afternoon in regards to needing to reschedule his injection with Dr. Alvester MorinNewton. He is scheduled to come in tomorrow 12-06-16.  His best CB # is [phone]. Thank you

## 2016-12-05 NOTE — Telephone Encounter (Signed)
Called pt back and now he does not wish to reschedule and will keep the appt for tomorrow.

## 2016-12-06 ENCOUNTER — Encounter (INDEPENDENT_AMBULATORY_CARE_PROVIDER_SITE_OTHER): Payer: Medicare Other | Admitting: Physical Medicine and Rehabilitation

## 2016-12-17 ENCOUNTER — Encounter (INDEPENDENT_AMBULATORY_CARE_PROVIDER_SITE_OTHER): Payer: Self-pay | Admitting: Physical Medicine and Rehabilitation

## 2016-12-17 ENCOUNTER — Ambulatory Visit (INDEPENDENT_AMBULATORY_CARE_PROVIDER_SITE_OTHER): Payer: Medicare Other | Admitting: Physical Medicine and Rehabilitation

## 2016-12-17 ENCOUNTER — Ambulatory Visit (INDEPENDENT_AMBULATORY_CARE_PROVIDER_SITE_OTHER): Payer: Medicare Other

## 2016-12-17 VITALS — BP 130/82 | HR 57 | Temp 97.8°F

## 2016-12-17 DIAGNOSIS — M5416 Radiculopathy, lumbar region: Secondary | ICD-10-CM | POA: Diagnosis not present

## 2016-12-17 DIAGNOSIS — M48062 Spinal stenosis, lumbar region with neurogenic claudication: Secondary | ICD-10-CM | POA: Diagnosis not present

## 2016-12-17 MED ORDER — LIDOCAINE HCL (PF) 1 % IJ SOLN
2.0000 mL | Freq: Once | INTRAMUSCULAR | Status: AC
  Administered 2016-12-17: 2 mL

## 2016-12-17 MED ORDER — METHYLPREDNISOLONE ACETATE 80 MG/ML IJ SUSP
80.0000 mg | Freq: Once | INTRAMUSCULAR | Status: AC
  Administered 2016-12-17: 80 mg

## 2016-12-17 NOTE — Progress Notes (Deleted)
Mid lower back pain with radiating sharp pain into buttocks. States occasionally pain will radiate down both legs into feet. States can feel sharp pain with every step. Worse with increased activity.  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 Takes asprin. Contrast allergy, burning sensation. Has driver.

## 2016-12-17 NOTE — Procedures (Signed)
Lumbosacral Transforaminal Epidural Steroid Injection - Infraneural Approach with Fluoroscopic Guidance  Patient: Micheal PulseLarry Butkiewicz      Date of Birth: 03/03/1951 MRN: [id number] PCP: Default, Provider, MD      Visit Date: 12/17/2016   Universal Protocol:     Consent Given By: the patient  Position: PRONE   Additional Comments: Vital signs were monitored before and after the procedure. Patient was prepped and draped in the usual sterile fashion. The correct patient, procedure, and site was verified.   Injection Procedure Details:  Procedure Site One Meds Administered:  Meds ordered this encounter  Medications  . lidocaine (PF) (XYLOCAINE) 1 % injection 2 mL  . methylPREDNISolone acetate (DEPO-MEDROL) injection 80 mg      Laterality: Bilateral  Location/Site:  L4-L5  Needle size: 22 G  Needle type: Spinal  Needle Placement: Transforaminal  Findings:  -Contrast Used: We did use 1 mL of on Omniscan mri contrast   -Comments: Excellent flow of contrast along the nerve and into the epidural space.  Procedure Details: After squaring off the end-plates of the desired vertebral level to get a true AP view, the C-arm was obliqued to the painful side so that the superior articulating process is positioned about 1/3 the length of the inferior endplate.  The needle was aimed toward the junction of the superior articular process and the transverse process of the inferior vertebrae. The needle's initial entry is in the lower third of the foramen through Kambin's triangle. The soft tissues overlying this target were infiltrated with 2-3 ml. of 1% Lidocaine without Epinephrine.  The spinal needle was then inserted and advanced toward the target using a "trajectory" view along the fluoroscope beam.  Under AP and lateral visualization, the needle was advanced so it did not puncture dura and did not traverse medially beyond the 6 o'clock position of the pedicle. Bi-planar projections were used to  confirm position. Aspiration was confirmed to be negative for CSF and/or blood. A 1-2 ml. volume of Isovue-250 was injected and flow of contrast was noted at each level. Radiographs were obtained for documentation purposes.   After attaining the desired flow of contrast documented above, a 0.5 to 1.0 ml test dose of 0.25% Marcaine was injected into each respective transforaminal space.  The patient was observed for 90 seconds post injection.  After no sensory deficits were reported, and normal lower extremity motor function was noted,   the above injectate was administered so that equal amounts of the injectate were placed at each foramen (level) into the transforaminal epidural space.   Additional Comments:  The patient tolerated the procedure well Dressing: Band-Aid    Post-procedure details: Patient was observed during the procedure. Post-procedure instructions were reviewed.  Patient left the clinic in stable condition.

## 2016-12-17 NOTE — Patient Instructions (Signed)

## 2016-12-18 ENCOUNTER — Encounter (INDEPENDENT_AMBULATORY_CARE_PROVIDER_SITE_OTHER): Payer: Self-pay | Admitting: Physical Medicine and Rehabilitation

## 2016-12-18 NOTE — Progress Notes (Signed)
Micheal Wood - 66 y.o. male MRN [id number]  Date of birth: 09-Aug-1950  Office Visit Note: Visit Date: 12/17/2016 PCP: Default, Provider, MD Referred by: Kerrin Champagne, MD  Subjective: Chief Complaint  Patient presents with  . Lower Back - Pain   HPI: Micheal Wood is a 66 year old gentleman followed by Dr. Otelia Sergeant for both his cervical and lumbar pain. Most recently he has been describing mid lower back pain with radiating sharp pain into the buttocks. This is worse with standing and ambulating. He states that occasionally and will refer down both legs into the feet and more of an L5 distribution. He gets very sharp pains with certain movements. This very sharp pain is very troublesome for him. It is worse with activity. He has to help with his father with care and with lifting him and this seems to be aggravating his back quite a bit. His father is in a wheelchair. He does take some aspirin and is taking other medications without relief. He does have an issue with contrast allergy. He reports this was oral contrast that caused alot of burning sensation but he had no rash or breathing difficulties. We will go ahead today and use Omniscan instead of iodine related contrast. Dr. Otelia Sergeant requests bilateral L4 transforaminal epidural steroid injections diagnostically and hopefully therapeutically. He does have severe stenosis at this level. MRI is reviewed below.    ROS Otherwise per HPI.  Assessment & Plan: Visit Diagnoses:  1. Spinal stenosis of lumbar region with neurogenic claudication   2. Lumbar radiculopathy     Plan: No additional findings.   Meds & Orders:  Meds ordered this encounter  Medications  . lidocaine (PF) (XYLOCAINE) 1 % injection 2 mL  . methylPREDNISolone acetate (DEPO-MEDROL) injection 80 mg    Orders Placed This Encounter  Procedures  . XR C-ARM NO REPORT  . Epidural Steroid injection    Follow-up: Return for Dr. Otelia Sergeant.   Procedures: No procedures performed    Lumbosacral Transforaminal Epidural Steroid Injection - Infraneural Approach with Fluoroscopic Guidance  Patient: Micheal Wood      Date of Birth: [birth year] MRN: [id number] PCP: Default, Provider, MD      Visit Date: 12/17/2016   Universal Protocol:     Consent Given By: the patient  Position: PRONE   Additional Comments: Vital signs were monitored before and after the procedure. Patient was prepped and draped in the usual sterile fashion. The correct patient, procedure, and site was verified.   Injection Procedure Details:  Procedure Site One Meds Administered:  Meds ordered this encounter  Medications  . lidocaine (PF) (XYLOCAINE) 1 % injection 2 mL  . methylPREDNISolone acetate (DEPO-MEDROL) injection 80 mg      Laterality: Bilateral  Location/Site:  L4-L5  Needle size: 22 G  Needle type: Spinal  Needle Placement: Transforaminal  Findings:  -Contrast Used: We did use 1 mL of on Omniscan mri contrast   -Comments: Excellent flow of contrast along the nerve and into the epidural space.  Procedure Details: After squaring off the end-plates of the desired vertebral level to get a true AP view, the C-arm was obliqued to the painful side so that the superior articulating process is positioned about 1/3 the length of the inferior endplate.  The needle was aimed toward the junction of the superior articular process and the transverse process of the inferior vertebrae. The needle's initial entry is in the lower third of the foramen through Kambin's triangle. The soft tissues overlying this target  were infiltrated with 2-3 ml. of 1% Lidocaine without Epinephrine.  The spinal needle was then inserted and advanced toward the target using a "trajectory" view along the fluoroscope beam.  Under AP and lateral visualization, the needle was advanced so it did not puncture dura and did not traverse medially beyond the 6 o'clock position of the pedicle. Bi-planar projections were used to  confirm position. Aspiration was confirmed to be negative for CSF and/or blood. A 1-2 ml. volume of Isovue-250 was injected and flow of contrast was noted at each level. Radiographs were obtained for documentation purposes.   After attaining the desired flow of contrast documented above, a 0.5 to 1.0 ml test dose of 0.25% Marcaine was injected into each respective transforaminal space.  The patient was observed for 90 seconds post injection.  After no sensory deficits were reported, and normal lower extremity motor function was noted,   the above injectate was administered so that equal amounts of the injectate were placed at each foramen (level) into the transforaminal epidural space.   Additional Comments:  The patient tolerated the procedure well Dressing: Band-Aid    Post-procedure details: Patient was observed during the procedure. Post-procedure instructions were reviewed.  Patient left the clinic in stable condition.   Clinical History: 11/02/2016   L2-3: Left greater than right lateral disc protrusions and short pedicles contribute to mild foraminal stenosis. Mild left subarticular narrowing is present as well.  L3-4: Lateral disc protrusions and short pedicles contribute to mild foraminal narrowing, left greater than right.  L4-5: A broad-based disc protrusion and right paramedian inferior extrusion are present. This results in severe central canal stenosis with contiguous displacement of the L5 nerve roots more inferiorly as well. Severe foraminal stenosis is worse on the right.  L5-S1: A broad-based disc protrusion is present. Moderate facet hypertrophy is worse on the right. Mild subarticular narrowing is present bilaterally. Moderate foraminal stenosis is worse on the right.  S1-2:  No focal stenosis.  IMPRESSION: 1. Disc disease and central canal stenosis is greatest at L4-5 with severe central and bilateral foraminal stenosis, right greater left. 2. In  addition to the L4-5 broad-based disc protrusion, there is a right inferior paramedian extrusion extending inferiorly along the L5 vertebral body. 3. Mild subarticular and moderate foraminal stenosis bilaterally L5-S1. 4. Lateral disc protrusions and short pedicles at L3-4 are worse on the left. 5. Mild left subarticular and bilateral foraminal narrowing at L2-3.   Electronically Signed   By: Marin Robertshristopher  Mattern M.D.   On: 13:09  He reports that he has never smoked. He has never used smokeless tobacco. No results for input(s): HGBA1C, LABURIC in the last 8760 hours.  Objective:  VS:  HT:    WT:   BMI:     BP:130/82  HR:(!) 57bpm  TEMP:97.8 F (36.6 C)( )  RESP:99 % Physical Exam  Musculoskeletal:  Patient is slow to rise from a seated position. He has good distal strength. He ambulates with a forward flexed spine.    Ortho Exam Imaging: Xr C-arm No Report  Result Date: 12/17/2016 Please see Notes or Procedures tab for imaging impression.   Past Medical/Family/Surgical/Social History: Medications & Allergies reviewed per EMR There are no active problems to display for this patient.  Past Medical History:  Diagnosis Date  . Arthritis    DDD  . Diabetes mellitus without complication (HCC)   . GERD (gastroesophageal reflux disease)   . Hypertension   . Narrowing of intervertebral disc space   .  Sleep apnea    CPAP   Family History  Problem Relation Age of Onset  . Hypertension Mother   . Ulcers Mother   . Hypertension Brother   . Cancer Brother   . Cancer Sister   . Stroke Paternal Grandmother    Past Surgical History:  Procedure Laterality Date  . u     removal of cysts x 3, chest x 2, wrists  . uvula removed     Social History   Occupational History  . retired     [employer], 20+ years   Social History Main Topics  . Smoking status: Never Smoker  . Smokeless tobacco: Never Used  . Alcohol use No  . Drug use: No  . Sexual activity: Not on file

## 2017-01-03 ENCOUNTER — Ambulatory Visit (INDEPENDENT_AMBULATORY_CARE_PROVIDER_SITE_OTHER): Payer: Medicare Other | Admitting: Specialist

## 2017-01-03 ENCOUNTER — Encounter (INDEPENDENT_AMBULATORY_CARE_PROVIDER_SITE_OTHER): Payer: Self-pay | Admitting: Specialist

## 2017-01-03 VITALS — BP 101/64 | HR 57 | Ht 67.0 in | Wt 188.0 lb

## 2017-01-03 DIAGNOSIS — Z981 Arthrodesis status: Secondary | ICD-10-CM | POA: Diagnosis not present

## 2017-01-03 DIAGNOSIS — M48062 Spinal stenosis, lumbar region with neurogenic claudication: Secondary | ICD-10-CM

## 2017-01-03 NOTE — Patient Instructions (Addendum)
Avoid bending, stooping and avoid lifting weights greater than 10 lbs. Avoid prolong standing and walking. Avoid frequent bending and stooping  No lifting greater than 10 lbs. May use ice or moist heat for pain. Weight loss is of benefit. Handicap license is approved. I would recommend another 2 sets of injections 2 weeks a part as an alternative to surgical treatment. These can be repeated every 6 months if he is agreeable to this form of management of the spinal stenosis, realizing that this medication only temporizes the discomfort.

## 2017-01-03 NOTE — Progress Notes (Signed)
Office Visit Note   Patient: Micheal Wood           Date of Birth: 02/03/1951           MRN: [id number] Visit Date: 01/03/2017              Requested by: No referring provider defined for this encounter. PCP: Default, Provider, MD   Assessment & Plan: Visit Diagnoses:  1. Spinal stenosis of lumbar region with neurogenic claudication   2. S/P cervical spinal fusion     Plan: Avoid bending, stooping and avoid lifting weights greater than 10 lbs. Avoid prolong standing and walking. Avoid frequent bending and stooping  No lifting greater than 10 lbs. May use ice or moist heat for pain. Weight loss is of benefit. Handicap license is approved. I would recommend another 2 sets of injections 2 weeks a part as an alternative to surgical treatment. These can be repeated every 6 months if he is agreeable to this form of management of the spinal stenosis, realizing that this medication only temporizes the discomfort.  Follow-Up Instructions: Return in about 4 weeks (around 01/31/2017).   Orders:  No orders of the defined types were placed in this encounter.  No orders of the defined types were placed in this encounter.     Procedures: No procedures performed   Clinical Data: No additional findings.   Subjective: Chief Complaint  Patient presents with  . Lower Back - Follow-up    Had Bilateral L4-5 TF on 12/17/16 with Dr. Alvester MorinNewton    66 year old male with history of cervical spinal stenosis [facility], he has been experiencing pain in his back and radiation into his leg with standing and walking, improving with sitting. Underwent bilateral TF ESIs for spinal stenosis L4-5 and returns indicating that the stenosis pain is better. The injections were 2 weeks ago on 12/17/2016 by Dr. Alvester MorinNewton.    Review of Systems  HENT: Negative.   Eyes: Negative.   Respiratory: Negative.   Cardiovascular: Negative.   Gastrointestinal: Negative.   Endocrine: Negative.     Genitourinary: Negative.   Musculoskeletal: Positive for back pain and gait problem.  Skin: Negative.   Allergic/Immunologic: Negative.   Neurological: Positive for weakness and numbness.  Hematological: Negative.   Psychiatric/Behavioral: Negative.      Objective: Vital Signs: BP 101/64 (BP Location: Left Arm, Patient Position: Sitting)   Pulse (!) 57   Ht 5\' 7"  (1.702 m)   Wt 188 lb (85.3 kg)   BMI 29.44 kg/m   Physical Exam  Ortho Exam  Specialty Comments:  No specialty comments available.  Imaging: No results found.   PMFS History: There are no active problems to display for this patient.  Past Medical History:  Diagnosis Date  . Arthritis    DDD  . Diabetes mellitus without complication (HCC)   . GERD (gastroesophageal reflux disease)   . Hypertension   . Narrowing of intervertebral disc space   . Sleep apnea    CPAP    Family History  Problem Relation Age of Onset  . Hypertension Mother   . Ulcers Mother   . Hypertension Brother   . Cancer Brother   . Cancer Sister   . Stroke Paternal Grandmother     Past Surgical History:  Procedure Laterality Date  . u     removal of cysts x 3, chest x 2, wrists  . uvula removed     Social History  Occupational History  . retired     [employer], 20+ years   Social History Main Topics  . Smoking status: Never Smoker  . Smokeless tobacco: Never Used  . Alcohol use No  . Drug use: No  . Sexual activity: Not on file

## 2017-01-29 ENCOUNTER — Telehealth (INDEPENDENT_AMBULATORY_CARE_PROVIDER_SITE_OTHER): Payer: Self-pay

## 2017-01-29 NOTE — Telephone Encounter (Signed)
Pt scheduled for 02/11/17 @ 9:30 w/driver and no BTS

## 2017-01-29 NOTE — Telephone Encounter (Signed)
Pt requesting another injection. Had Bil L4-5 TF on 12/17/16. Ok to repeat?

## 2017-01-29 NOTE — Telephone Encounter (Signed)
Ok to repeat per [place] last note

## 2017-02-11 ENCOUNTER — Ambulatory Visit (INDEPENDENT_AMBULATORY_CARE_PROVIDER_SITE_OTHER): Payer: Medicare Other | Admitting: Physical Medicine and Rehabilitation

## 2017-02-11 ENCOUNTER — Encounter (INDEPENDENT_AMBULATORY_CARE_PROVIDER_SITE_OTHER): Payer: Self-pay | Admitting: Physical Medicine and Rehabilitation

## 2017-02-11 ENCOUNTER — Ambulatory Visit (INDEPENDENT_AMBULATORY_CARE_PROVIDER_SITE_OTHER): Payer: Medicare Other

## 2017-02-11 VITALS — BP 113/77 | HR 57

## 2017-02-11 DIAGNOSIS — M5416 Radiculopathy, lumbar region: Secondary | ICD-10-CM | POA: Diagnosis not present

## 2017-02-11 DIAGNOSIS — M48062 Spinal stenosis, lumbar region with neurogenic claudication: Secondary | ICD-10-CM | POA: Diagnosis not present

## 2017-02-11 MED ORDER — LIDOCAINE HCL (PF) 1 % IJ SOLN
2.0000 mL | Freq: Once | INTRAMUSCULAR | Status: AC
  Administered 2017-02-11: 2 mL

## 2017-02-11 MED ORDER — BETAMETHASONE SOD PHOS & ACET 6 (3-3) MG/ML IJ SUSP
12.0000 mg | Freq: Once | INTRAMUSCULAR | Status: AC
  Administered 2017-02-11: 12 mg

## 2017-02-11 NOTE — Progress Notes (Deleted)
Patient states he did well with last injection. Was almost pain free for several weeks He states right how he is still doing better than he was prior to the injection but getting hard to walk long periods without pain. Says pain seems to be a little lower now than it was.

## 2017-02-11 NOTE — Progress Notes (Unsigned)
Fluoro Time: 1 minute 7 sec Mgy: 62.76

## 2017-02-11 NOTE — Patient Instructions (Signed)

## 2017-02-12 NOTE — Procedures (Signed)
Mr. Bautch is a pleasant 66 year old gentleman followed by Dr. Theda Sers for his neck and low back pain. He has MRI findings of severe stenosis at L4-5 which is somewhat multifactorial but really significant disc herniation at this level. Prior bilateral L4 transforaminal injection in June gave him almost pain-free for relief for several weeks. He feels like right now he is still doing better than he was prior to the injection but is getting harder walk long periods without pain. He does get neurogenic claudication symptoms. He feels like in way the pain seems to be a little bit lower down into the buttock region than it was before. He's had no new trauma or other findings or focal weakness. A repeat the bilateral L4 transforaminal injections as requested by Dr. Otelia Sergeant. Lumbosacral Transforaminal Epidural Steroid Injection - Sub-Pedicular Approach with Fluoroscopic Guidance  Patient: Micheal Wood      Date of Birth: 1951/02/21 MRN: [id number] PCP: Default, Provider, MD      Visit Date: 02/11/2017   Universal Protocol:    Date/Time: 02/11/2017  Consent Given By: the patient  Position: PRONE  Additional Comments: Vital signs were monitored before and after the procedure. Patient was prepped and draped in the usual sterile fashion. The correct patient, procedure, and site was verified.   Injection Procedure Details:  Procedure Site One Meds Administered:  Meds ordered this encounter  Medications  . lidocaine (PF) (XYLOCAINE) 1 % injection 2 mL  . betamethasone acetate-betamethasone sodium phosphate (CELESTONE) injection 12 mg    Laterality: Bilateral  Location/Site:  L4-L5  Needle size: 22 G  Needle type: Spinal  Needle Placement: Transforaminal  Findings:  -Contrast Used: 1 mL iohexol 180 mg iodine/mL   -Comments: Excellent flow of contrast along the nerve and into the epidural space. Initial flow of contrast on the right was more intra-articular but with repositioning we did get  good flow of contrast epidurally. On the left the patient did have increased pain with needling through a significant what appear to be trigger point. Fluoroscopic images revealed a liver nowhere near the spine at that point and the response to the needling was very similar to trigger point response. We actually just removed the needle back up to the sub-dermal layer of scan and repositioned the needle and then the injection went very smoothly.  Procedure Details: After squaring off the end-plates to get a true AP view, the C-arm was positioned so that an oblique view of the foramen as noted above was visualized. The target area is just inferior to the "nose of the scotty dog" or sub pedicular. The soft tissues overlying this structure were infiltrated with 2-3 ml. of 1% Lidocaine without Epinephrine.  The spinal needle was inserted toward the target using a "trajectory" view along the fluoroscope beam.  Under AP and lateral visualization, the needle was advanced so it did not puncture dura and was located close the 6 O'Clock position of the pedical in AP tracterory. Biplanar projections were used to confirm position. Aspiration was confirmed to be negative for CSF and/or blood. A 1-2 ml. volume of Isovue-250 was injected and flow of contrast was noted at each level. Radiographs were obtained for documentation purposes.   After attaining the desired flow of contrast documented above, a 0.5 to 1.0 ml test dose of 0.25% Marcaine was injected into each respective transforaminal space.  The patient was observed for 90 seconds post injection.  After no sensory deficits were reported, and normal lower extremity motor function was noted,  the above injectate was administered so that equal amounts of the injectate were placed at each foramen (level) into the transforaminal epidural space.   Additional Comments:  No complications occurred Dressing: Band-Aid    Post-procedure details: Patient was observed  during the procedure. Post-procedure instructions were reviewed.  Patient left the clinic in stable condition.

## 2017-02-26 ENCOUNTER — Telehealth (INDEPENDENT_AMBULATORY_CARE_PROVIDER_SITE_OTHER): Payer: Self-pay | Admitting: Specialist

## 2017-02-26 NOTE — Telephone Encounter (Signed)
Patient called wanting to know if he had a RX for pain sent into his pharmacy? And if not, could one be sent in? Preferably something that is easy on the kidneys.----I don't see that anything has been ordered----Please advise

## 2017-02-26 NOTE — Telephone Encounter (Signed)
Patient called wanting to know if he had a RX for pain sent into his pharmacy? And if not, could one be sent in? Preferably something that is easy on the kidneys. CB # [phone]

## 2017-02-27 ENCOUNTER — Telehealth (INDEPENDENT_AMBULATORY_CARE_PROVIDER_SITE_OTHER): Payer: Self-pay | Admitting: Radiology

## 2017-02-27 NOTE — Telephone Encounter (Signed)
Patient called asking if the pain medication could be sent to the [facility] at [address] [place], [place]. He will be there til Friday and just wanted a few to hold him over til he gets back home.

## 2017-02-28 ENCOUNTER — Ambulatory Visit (INDEPENDENT_AMBULATORY_CARE_PROVIDER_SITE_OTHER): Payer: Medicare Other | Admitting: Specialist

## 2017-02-28 NOTE — Telephone Encounter (Signed)
Duplicate message. 

## 2017-03-26 NOTE — Telephone Encounter (Signed)
I have a license to practice medicine in [place] only and am not willing to prescribe in other states. jen

## 2017-03-27 NOTE — Telephone Encounter (Signed)
I called patient to advise that we can not send in meds out of state.  He states that he is bak home now and that he would like to get something if possible for his pain.  He said that it has to be something that would be easy on his Kidneys. Please advise.  He states that he would not be taking all the time that it would be for when he is really flared up.

## 2017-04-22 ENCOUNTER — Encounter (INDEPENDENT_AMBULATORY_CARE_PROVIDER_SITE_OTHER): Payer: Self-pay | Admitting: Specialist

## 2017-04-22 ENCOUNTER — Ambulatory Visit (INDEPENDENT_AMBULATORY_CARE_PROVIDER_SITE_OTHER): Payer: Medicare Other | Admitting: Specialist

## 2017-04-22 VITALS — BP 128/87 | HR 74 | Ht 67.0 in | Wt 188.0 lb

## 2017-04-22 DIAGNOSIS — M48062 Spinal stenosis, lumbar region with neurogenic claudication: Secondary | ICD-10-CM

## 2017-04-22 DIAGNOSIS — M4322 Fusion of spine, cervical region: Secondary | ICD-10-CM

## 2017-04-22 MED ORDER — BACLOFEN 10 MG PO TABS: 10.0000 mg | ORAL_TABLET | Freq: Three times a day (TID) | ORAL | 0 refills | Status: DC

## 2017-04-22 MED ORDER — ACETAMINOPHEN-CODEINE #4 300-60 MG PO TABS
1.0000 | ORAL_TABLET | ORAL | 0 refills | Status: DC | PRN
Start: 2017-04-22 — End: 2017-11-19

## 2017-04-22 NOTE — Progress Notes (Signed)
Office Visit Note   Patient: Micheal Wood           Date of Birth: 12-Jul-1950           MRN: [id number] Visit Date: 04/22/2017              Requested by: No referring provider defined for this encounter. PCP: Default, Provider, MD   Assessment & Plan: Visit Diagnoses:  1. Spinal stenosis of lumbar region with neurogenic claudication   2. Fusion of spine of cervical region     Plan: Tylenol #3 for pain  Baclofen 10 mg for spasm. Avoid bending, stooping and avoid lifting weights greater than 10 lbs. Avoid prolong standing and walking. Avoid frequent bending and stooping  No lifting greater than 10 lbs. May use ice or moist heat for pain. Weight loss is of benefit. Handicap license is approved. Avoid overhead lifting and overhead use of the arms. Do not lift greater than 5 lbs. Adjust head rest in vehicle to prevent hyperextension if rear ended. Take extra precautions to avoid falling, including use of a cane if you feel weak. Fall Prevention and Home Safety Falls cause injuries and can affect all age groups. It is possible to use preventive measures to significantly decrease the likelihood of falls. There are many simple measures which can make your home safer and prevent falls. OUTDOORS  Repair cracks and edges of walkways and driveways.  Remove high doorway thresholds.  Trim shrubbery on the main path into your home.  Have good outside lighting.  Clear walkways of tools, rocks, debris, and clutter.  Check that handrails are not broken and are securely fastened. Both sides of steps should have handrails.  Have leaves, snow, and ice cleared regularly.  Use sand or salt on walkways during winter months.  In the garage, clean up grease or oil spills. BATHROOM  Install night lights.  Install grab bars by the toilet and in the tub and shower.  Use non-skid mats or decals in the tub or shower.  Place a plastic non-slip stool in the shower to sit on, if  needed.  Keep floors dry and clean up all water on the floor immediately.  Remove soap buildup in the tub or shower on a regular basis.  Secure bath mats with non-slip, double-sided rug tape.  Remove throw rugs and tripping hazards from the floors. BEDROOMS  Install night lights.  Make sure a bedside light is easy to reach.  Do not use oversized bedding.  Keep a telephone by your bedside.  Have a firm chair with side arms to use for getting dressed.  Remove throw rugs and tripping hazards from the floor. KITCHEN  Keep handles on pots and pans turned toward the center of the stove. Use back burners when possible.  Clean up spills quickly and allow time for drying.  Avoid walking on wet floors.  Avoid hot utensils and knives.  Position shelves so they are not too high or low.  Place commonly used objects within easy reach.  If necessary, use a sturdy step stool with a grab bar when reaching.  Keep electrical cables out of the way.  Do not use floor polish or wax that makes floors slippery. If you must use wax, use non-skid floor wax.  Remove throw rugs and tripping hazards from the floor. STAIRWAYS  Never leave objects on stairs.  Place handrails on both sides of stairways and use them. Fix any loose handrails. Make sure handrails on both  sides of the stairways are as long as the stairs.  Check carpeting to make sure it is firmly attached along stairs. Make repairs to worn or loose carpet promptly.  Avoid placing throw rugs at the top or bottom of stairways, or properly secure the rug with carpet tape to prevent slippage. Get rid of throw rugs, if possible.  Have an electrician put in a light switch at the top and bottom of the stairs. OTHER FALL PREVENTION TIPS  Wear low-heel or rubber-soled shoes that are supportive and fit well. Wear closed toe shoes.  When using a stepladder, make sure it is fully opened and both spreaders are firmly locked. Do not climb a  closed stepladder.  Add color or contrast paint or tape to grab bars and handrails in your home. Place contrasting color strips on first and last steps.  Learn and use mobility aids as needed. Install an electrical emergency response system.  Turn on lights to avoid dark areas. Replace light bulbs that burn out immediately. Get light switches that glow.  Arrange furniture to create clear pathways. Keep furniture in the same place.  Firmly attach carpet with non-skid or double-sided tape.  Eliminate uneven floor surfaces.  Select a carpet pattern that does not visually hide the edge of steps.  Be aware of all pets. OTHER HOME SAFETY TIPS  Set the water temperature for 120 F (48.8 C).  Keep emergency numbers on or near the telephone.  Keep smoke detectors on every level of the home and near sleeping areas. Document Released: 06/15/2002 Document Revised: 12/25/2011 Document Reviewed: 09/14/2011 [facility] Patient Information 2014 [place], [place]. Follow-Up Instructions: Return in about 6 weeks (around 06/03/2017).   Orders:  No orders of the defined types were placed in this encounter.  No orders of the defined types were placed in this encounter.     Procedures: No procedures performed   Clinical Data: No additional findings.   Subjective: Chief Complaint  Patient presents with  . Lower Back - Follow-up    Had injection on 02/11/17 with Dr. Alvester Morin Bilateral [id number]    66 year old male right hand dominant s/p cervical fusion by Dr. Wyline Mood at [facility] in 05/2016.  Last seen 4-5 months ago and has another follow up appointment in the next 05/08/2017. He has had approval by the [facility] for PT and he is going to [facility] here in [place], [place] and [employer].They are working on the lower Back. He is now in a gym also for upper extremity and lower extremity. Prolong standing and walking on certain days. The tightness with cool weather and in the AM. Hot shower  helps Hot towel also helps.      Review of Systems  Constitutional: Negative.   HENT: Negative.   Eyes: Negative.   Respiratory: Negative.   Cardiovascular: Negative.   Gastrointestinal: Negative.   Endocrine: Negative.   Genitourinary: Negative.   Musculoskeletal: Negative.   Skin: Negative.   Allergic/Immunologic: Negative.   Neurological: Negative.   Hematological: Negative.   Psychiatric/Behavioral: Negative.      Objective: Vital Signs: BP 128/87 (BP Location: Left Arm, Patient Position: Sitting)   Pulse 74   Ht  (1.702 m)   Wt 188 lb (85.3 kg)   BMI 29.44 kg/m   Physical Exam  Constitutional: He is oriented to person, place, and time. He appears well-developed and well-nourished.  HENT:  Head: Normocephalic and atraumatic.  Eyes: Pupils are equal, round, and reactive to light.  EOM are normal.  Neck: Normal range of motion. Neck supple.  Pulmonary/Chest: Effort normal and breath sounds normal.  Abdominal: Soft. Bowel sounds are normal.  Neurological: He is alert and oriented to person, place, and time.  Skin: Skin is warm and dry.  Psychiatric: He has a normal mood and affect. His behavior is normal. Judgment and thought content normal.    Back Exam   Range of Motion  Extension: abnormal  Flexion: normal  Lateral Bend Right: abnormal  Lateral Bend Left: abnormal  Rotation Right: abnormal   Muscle Strength  Right Quadriceps:  5/5  Left Quadriceps:  5/5  Right Hamstrings:  5/5  Left Hamstrings:  5/5   Tests  Straight leg raise right: negative Straight leg raise left: negative  Reflexes  Patellar: normal Achilles: normal Biceps: normal Babinski's sign: normal   Other  Toe Walk: normal Heel Walk: normal Sensation: normal Gait: normal  Erythema: no back redness Scars: present  Comments:  Neck motion is decreased lateral bending and rotation by 25-30%      Specialty Comments:  No specialty comments available.  Imaging: No  results found.   PMFS History: There are no active problems to display for this patient.  Past Medical History:  Diagnosis Date  . Arthritis    DDD  . Diabetes mellitus without complication (HCC)   . GERD (gastroesophageal reflux disease)   . Hypertension   . Narrowing of intervertebral disc space   . Sleep apnea    CPAP    Family History  Problem Relation Age of Onset  . Hypertension Mother   . Ulcers Mother   . Hypertension Brother   . Cancer Brother   . Cancer Sister   . Stroke Paternal Grandmother     Past Surgical History:  Procedure Laterality Date  . u     removal of cysts x 3, chest x 2, wrists  . uvula removed     Social History   Occupational History  . retired     [employer], 20+ years   Social History Main Topics  . Smoking status: Never Smoker  . Smokeless tobacco: Never Used  . Alcohol use No  . Drug use: No  . Sexual activity: Not on file

## 2017-04-22 NOTE — Patient Instructions (Addendum)
Tylenol #3 for pain  Baclofen 10 mg for spasm. Avoid bending, stooping and avoid lifting weights greater than 10 lbs. Avoid prolong standing and walking. Avoid frequent bending and stooping  No lifting greater than 10 lbs. May use ice or moist heat for pain. Weight loss is of benefit. Handicap license is approved. Avoid overhead lifting and overhead use of the arms. Do not lift greater than 5 lbs. Adjust head rest in vehicle to prevent hyperextension if rear ended. Take extra precautions to avoid falling, including use of a cane if you feel weak. Fall Prevention and Home Safety Falls cause injuries and can affect all age groups. It is possible to use preventive measures to significantly decrease the likelihood of falls. There are many simple measures which can make your home safer and prevent falls. OUTDOORS  Repair cracks and edges of walkways and driveways.  Remove high doorway thresholds.  Trim shrubbery on the main path into your home.  Have good outside lighting.  Clear walkways of tools, rocks, debris, and clutter.  Check that handrails are not broken and are securely fastened. Both sides of steps should have handrails.  Have leaves, snow, and ice cleared regularly.  Use sand or salt on walkways during winter months.  In the garage, clean up grease or oil spills. BATHROOM  Install night lights.  Install grab bars by the toilet and in the tub and shower.  Use non-skid mats or decals in the tub or shower.  Place a plastic non-slip stool in the shower to sit on, if needed.  Keep floors dry and clean up all water on the floor immediately.  Remove soap buildup in the tub or shower on a regular basis.  Secure bath mats with non-slip, double-sided rug tape.  Remove throw rugs and tripping hazards from the floors. BEDROOMS  Install night lights.  Make sure a bedside light is easy to reach.  Do not use oversized bedding.  Keep a telephone by your  bedside.  Have a firm chair with side arms to use for getting dressed.  Remove throw rugs and tripping hazards from the floor. KITCHEN  Keep handles on pots and pans turned toward the center of the stove. Use back burners when possible.  Clean up spills quickly and allow time for drying.  Avoid walking on wet floors.  Avoid hot utensils and knives.  Position shelves so they are not too high or low.  Place commonly used objects within easy reach.  If necessary, use a sturdy step stool with a grab bar when reaching.  Keep electrical cables out of the way.  Do not use floor polish or wax that makes floors slippery. If you must use wax, use non-skid floor wax.  Remove throw rugs and tripping hazards from the floor. STAIRWAYS  Never leave objects on stairs.  Place handrails on both sides of stairways and use them. Fix any loose handrails. Make sure handrails on both sides of the stairways are as long as the stairs.  Check carpeting to make sure it is firmly attached along stairs. Make repairs to worn or loose carpet promptly.  Avoid placing throw rugs at the top or bottom of stairways, or properly secure the rug with carpet tape to prevent slippage. Get rid of throw rugs, if possible.  Have an electrician put in a light switch at the top and bottom of the stairs. OTHER FALL PREVENTION TIPS  Wear low-heel or rubber-soled shoes that are supportive and fit well. Wear closed  toe shoes.  When using a stepladder, make sure it is fully opened and both spreaders are firmly locked. Do not climb a closed stepladder.  Add color or contrast paint or tape to grab bars and handrails in your home. Place contrasting color strips on first and last steps.  Learn and use mobility aids as needed. Install an electrical emergency response system.  Turn on lights to avoid dark areas. Replace light bulbs that burn out immediately. Get light switches that glow.  Arrange furniture to create clear  pathways. Keep furniture in the same place.  Firmly attach carpet with non-skid or double-sided tape.  Eliminate uneven floor surfaces.  Select a carpet pattern that does not visually hide the edge of steps.  Be aware of all pets. OTHER HOME SAFETY TIPS  Set the water temperature for 120 F (48.8 C).  Keep emergency numbers on or near the telephone.  Keep smoke detectors on every level of the home and near sleeping areas. Document Released: 06/15/2002 Document Revised: 12/25/2011 Document Reviewed: 09/14/2011 [facility] Patient Information 2014 [place], [place].

## 2017-05-29 ENCOUNTER — Encounter (INDEPENDENT_AMBULATORY_CARE_PROVIDER_SITE_OTHER): Payer: Self-pay | Admitting: Specialist

## 2017-05-29 ENCOUNTER — Ambulatory Visit (INDEPENDENT_AMBULATORY_CARE_PROVIDER_SITE_OTHER): Payer: Medicare Other | Admitting: Specialist

## 2017-05-29 VITALS — BP 114/77 | HR 49 | Ht 67.0 in | Wt 188.0 lb

## 2017-05-29 DIAGNOSIS — M48062 Spinal stenosis, lumbar region with neurogenic claudication: Secondary | ICD-10-CM | POA: Diagnosis not present

## 2017-05-29 DIAGNOSIS — M4802 Spinal stenosis, cervical region: Secondary | ICD-10-CM

## 2017-05-29 NOTE — Progress Notes (Signed)
Office Visit Note   Patient: Micheal PulseLarry Wood           Date of Birth: 11/11/1950           MRN: [id number] Visit Date: 05/29/2017              Requested by: No referring provider defined for this encounter. PCP: Default, Provider, MD   Assessment & Plan: Visit Diagnoses:  1. Spinal stenosis of cervical region   2. Spinal stenosis, lumbar region, with neurogenic claudication     Plan: Avoid bending, stooping and avoid lifting weights greater than 10 lbs. Avoid prolong standing and walking. Avoid frequent bending and stooping  No lifting greater than 10 lbs. May use ice or moist heat for pain. Weight loss is of benefit. Handicap license is approved.  Follow-Up Instructions: Return in about 2 months (around 07/29/2017).   Orders:  No orders of the defined types were placed in this encounter.  No orders of the defined types were placed in this encounter.     Procedures: No procedures performed   Clinical Data: No additional findings.   Subjective: Chief Complaint  Patient presents with  . Neck - Follow-up  . Lower Back - Follow-up    66 year old male has had a cervical fusion 360 degree with posterior decompression and anterior discectomy and fusions from C    Review of Systems  Constitutional: Negative.   HENT: Negative.   Eyes: Negative.   Respiratory: Negative.   Cardiovascular: Negative.   Gastrointestinal: Negative.   Endocrine: Negative.   Genitourinary: Negative.   Musculoskeletal: Negative.   Skin: Negative.   Allergic/Immunologic: Negative.   Neurological: Negative.   Hematological: Negative.   Psychiatric/Behavioral: Negative.      Objective: Vital Signs: BP 114/77 (BP Location: Left Arm, Patient Position: Sitting)   Wood (!) 49   Ht 5\' 7"  (1.702 m)   Wt 188 lb (85.3 kg)   BMI 29.44 kg/m   Physical Exam  Constitutional: He is oriented to person, place, and time. He appears well-developed and well-nourished.  HENT:  Head:  Normocephalic and atraumatic.  Eyes: EOM are normal. Pupils are equal, round, and reactive to light.  Neck: Normal range of motion. Neck supple.  Pulmonary/Chest: Effort normal and breath sounds normal.  Abdominal: Soft. Bowel sounds are normal.  Neurological: He is alert and oriented to person, place, and time.  Skin: Skin is warm and dry.  Psychiatric: He has a normal mood and affect. His behavior is normal. Judgment and thought content normal.    Back Exam   Tenderness  The patient is experiencing tenderness in the lumbar.  Range of Motion  Extension: abnormal  Flexion: normal  Lateral bend right: abnormal  Lateral bend left: abnormal  Rotation right: abnormal  Rotation left: abnormal   Muscle Strength  Right Quadriceps:  5/5  Left Quadriceps:  5/5  Right Hamstrings:  5/5  Left Hamstrings:  5/5   Tests  Straight leg raise right: negative Straight leg raise left: negative  Reflexes  Patellar: normal Achilles: normal Biceps: normal Babinski's sign: normal   Other  Toe walk: normal Heel walk: normal Sensation: normal Gait: normal       Specialty Comments:  No specialty comments available.  Imaging: No results found.   PMFS History: There are no active problems to display for this patient.  Past Medical History:  Diagnosis Date  . Arthritis    DDD  . Diabetes mellitus without complication (HCC)   .  GERD (gastroesophageal reflux disease)   . Hypertension   . Narrowing of intervertebral disc space   . Sleep apnea    CPAP    Family History  Problem Relation Age of Onset  . Hypertension Mother   . Ulcers Mother   . Hypertension Brother   . Cancer Brother   . Cancer Sister   . Stroke Paternal Grandmother     Past Surgical History:  Procedure Laterality Date  . u     removal of cysts x 3, chest x 2, wrists  . uvula removed     Social History   Occupational History  . Occupation: retired    Comment: [employer], 20+ years  Tobacco Use  .  Smoking status: Never Smoker  . Smokeless tobacco: Never Used  Substance and Sexual Activity  . Alcohol use: No  . Drug use: No  . Sexual activity: Not on file

## 2017-05-29 NOTE — Patient Instructions (Signed)
Avoid bending, stooping and avoid lifting weights greater than 10 lbs. Avoid prolong standing and walking. Avoid frequent bending and stooping  No lifting greater than 10 lbs. May use ice or moist heat for pain. Weight loss is of benefit. Handicap license is approved.  

## 2017-07-30 ENCOUNTER — Telehealth (INDEPENDENT_AMBULATORY_CARE_PROVIDER_SITE_OTHER): Payer: Self-pay | Admitting: Specialist

## 2017-07-30 NOTE — Telephone Encounter (Signed)
Patient is coming in tomorrow, possibly this afternoon to fill out a medical record release form so he can get his past medical records for his attorney. He said just to give him a call if you have any questions. [phone]

## 2017-07-31 ENCOUNTER — Encounter (INDEPENDENT_AMBULATORY_CARE_PROVIDER_SITE_OTHER): Payer: Self-pay | Admitting: Specialist

## 2017-07-31 ENCOUNTER — Ambulatory Visit (INDEPENDENT_AMBULATORY_CARE_PROVIDER_SITE_OTHER): Payer: Medicare Other | Admitting: Specialist

## 2017-07-31 VITALS — BP 127/85 | HR 82 | Ht 67.0 in | Wt 188.0 lb

## 2017-07-31 DIAGNOSIS — Z981 Arthrodesis status: Secondary | ICD-10-CM

## 2017-07-31 DIAGNOSIS — M4722 Other spondylosis with radiculopathy, cervical region: Secondary | ICD-10-CM

## 2017-07-31 DIAGNOSIS — M48062 Spinal stenosis, lumbar region with neurogenic claudication: Secondary | ICD-10-CM | POA: Diagnosis not present

## 2017-07-31 NOTE — Telephone Encounter (Signed)
noted 

## 2017-07-31 NOTE — Patient Instructions (Addendum)
Avoid overhead lifting and overhead use of the arms. Do not lift greater than 15 lbs. Adjust head rest in vehicle to prevent hyperextension if rear ended. Take extra precautions to avoid falling. Avoid bending, stooping and avoid lifting weights greater than 15 lbs.  No lifting greater than 15 lbs. May use ice or moist heat for pain. Weight loss is of benefit. Handicap license is approved. Centrum Silver or regular centrum daily.

## 2017-07-31 NOTE — Progress Notes (Signed)
Office Visit Note   Patient: Micheal PulseLarry Wood           Date of Birth: 08/28/1950           MRN: [id number] Visit Date: 07/31/2017              Requested by: No referring provider defined for this encounter. PCP: Default, Provider, MD   Assessment & Plan: Visit Diagnoses:  1. S/P cervical spinal fusion   2. Other spondylosis with radiculopathy, cervical region   3. Spinal stenosis of lumbar region with neurogenic claudication     Plan: Avoid overhead lifting and overhead use of the arms. Do not lift greater than 15 lbs. Adjust head rest in vehicle to prevent hyperextension if rear ended. Take extra precautions to avoid falling. Avoid bending, stooping and avoid lifting weights greater than 15 lbs.  No lifting greater than 15 lbs. May use ice or moist heat for pain. Weight loss is of benefit. Handicap license is approved.  Follow-Up Instructions: No Follow-up on file.   Orders:  No orders of the defined types were placed in this encounter.  No orders of the defined types were placed in this encounter.     Procedures: No procedures performed   Clinical Data: No additional findings.   Subjective: Chief Complaint  Patient presents with  . Neck - Follow-up  . Lower Back - Follow-up    67 year old male with history of cervical stenosis and lumbar spinal stenosis. He has had treatments while in the [employer], an exercise program by trainers that he believes worsened his  Condition and caused atrophy of the right paralumbar muscle.    Review of Systems  Constitutional: Positive for activity change and unexpected weight change.  HENT: Positive for trouble swallowing. Negative for congestion, dental problem, drooling, ear discharge, ear pain, facial swelling, hearing loss, mouth sores, nosebleeds, postnasal drip, rhinorrhea, sinus pressure, sinus pain, sneezing, sore throat, tinnitus and voice change.   Eyes: Negative.  Negative for photophobia, pain, discharge, redness,  itching and visual disturbance.  Respiratory: Positive for apnea. Negative for cough, choking, chest tightness, shortness of breath, wheezing and stridor.   Cardiovascular: Negative for chest pain, palpitations and leg swelling.  Gastrointestinal: Negative.  Negative for abdominal distention, abdominal pain, anal bleeding, blood in stool, constipation, diarrhea and nausea.  Endocrine: Negative.  Negative for cold intolerance, heat intolerance, polydipsia and polyphagia.  Genitourinary: Negative.  Negative for difficulty urinating, dysuria, enuresis, flank pain, frequency, genital sores and hematuria.  Musculoskeletal: Positive for back pain. Negative for arthralgias, gait problem, joint swelling and myalgias.  Skin: Negative.  Negative for color change, pallor, rash and wound.  Allergic/Immunologic: Negative.  Negative for environmental allergies, food allergies and immunocompromised state.  Neurological: Positive for numbness. Negative for dizziness, tremors, seizures, syncope, facial asymmetry, speech difficulty, weakness, light-headedness and headaches.  Hematological: Negative.  Negative for adenopathy. Does not bruise/bleed easily.  Psychiatric/Behavioral: Negative.  Negative for agitation, behavioral problems, confusion, decreased concentration, dysphoric mood, hallucinations, self-injury, sleep disturbance and suicidal ideas. The patient is not nervous/anxious and is not hyperactive.      Objective: Vital Signs: BP 127/85 (BP Location: Left Arm, Patient Position: Sitting)   Wood 82   Ht 5\' 7"  (1.702 m)   Wt 188 lb (85.3 kg)   BMI 29.44 kg/m   Physical Exam  Constitutional: He is oriented to person, place, and time. He appears well-developed and well-nourished.  HENT:  Head: Normocephalic and atraumatic.  Eyes: EOM are normal.  Pupils are equal, round, and reactive to light.  Neck: Normal range of motion. Neck supple.  Pulmonary/Chest: Effort normal and breath sounds normal.    Abdominal: Soft. Bowel sounds are normal.  Musculoskeletal: Normal range of motion.  Neurological: He is alert and oriented to person, place, and time.  Skin: Skin is warm and dry.  Psychiatric: He has a normal mood and affect. His behavior is normal. Judgment and thought content normal.    Back Exam   Tenderness  The patient is experiencing tenderness in the cervical and lumbar.  Range of Motion  Extension: normal  Flexion: normal  Lateral bend right: normal  Lateral bend left: normal  Rotation right: normal  Rotation left: normal   Muscle Strength  Right Quadriceps:  5/5  Left Quadriceps:  5/5  Right Hamstrings:  5/5  Left Hamstrings:  5/5   Tests  Straight leg raise right: negative Straight leg raise left: negative  Reflexes  Patellar:  2/4 normal Achilles: 2/4 Babinski's sign: normal   Other  Toe walk: normal Heel walk: normal Sensation: normal Gait: normal  Erythema: no back redness Scars: absent      Specialty Comments:  No specialty comments available.  Imaging: No results found.   PMFS History: There are no active problems to display for this patient.  Past Medical History:  Diagnosis Date  . Arthritis    DDD  . Diabetes mellitus without complication (HCC)   . GERD (gastroesophageal reflux disease)   . Hypertension   . Narrowing of intervertebral disc space   . Sleep apnea    CPAP    Family History  Problem Relation Age of Onset  . Hypertension Mother   . Ulcers Mother   . Hypertension Brother   . Cancer Brother   . Cancer Sister   . Stroke Paternal Grandmother     Past Surgical History:  Procedure Laterality Date  . u     removal of cysts x 3, chest x 2, wrists  . uvula removed     Social History   Occupational History  . Occupation: retired    Comment: [employer], 20+ years  Tobacco Use  . Smoking status: Never Smoker  . Smokeless tobacco: Never Used  Substance and Sexual Activity  . Alcohol use: No  . Drug use: No   . Sexual activity: Not on file

## 2017-08-05 ENCOUNTER — Telehealth (INDEPENDENT_AMBULATORY_CARE_PROVIDER_SITE_OTHER): Payer: Self-pay | Admitting: Specialist

## 2017-08-05 NOTE — Telephone Encounter (Signed)
07/31/2017 OV NOTE MAILED TO PATIENT

## 2017-10-05 ENCOUNTER — Emergency Department (HOSPITAL_COMMUNITY): Payer: Medicare Other

## 2017-10-05 ENCOUNTER — Encounter (HOSPITAL_COMMUNITY): Payer: Self-pay

## 2017-10-05 ENCOUNTER — Emergency Department (HOSPITAL_COMMUNITY)
Admission: EM | Admit: 2017-10-05 | Discharge: 2017-10-05 | Disposition: A | Discharge status: Home / Self Care | Payer: Medicare Other | Attending: Emergency Medicine | Admitting: Emergency Medicine

## 2017-10-05 DIAGNOSIS — E119 Type 2 diabetes mellitus without complications: Secondary | ICD-10-CM | POA: Diagnosis not present

## 2017-10-05 DIAGNOSIS — E876 Hypokalemia: Secondary | ICD-10-CM | POA: Diagnosis not present

## 2017-10-05 DIAGNOSIS — Z79899 Other long term (current) drug therapy: Secondary | ICD-10-CM | POA: Diagnosis not present

## 2017-10-05 DIAGNOSIS — Z7982 Long term (current) use of aspirin: Secondary | ICD-10-CM | POA: Diagnosis not present

## 2017-10-05 DIAGNOSIS — H8110 Benign paroxysmal vertigo, unspecified ear: Secondary | ICD-10-CM | POA: Insufficient documentation

## 2017-10-05 DIAGNOSIS — I10 Essential (primary) hypertension: Secondary | ICD-10-CM | POA: Insufficient documentation

## 2017-10-05 DIAGNOSIS — H8111 Benign paroxysmal vertigo, right ear: Secondary | ICD-10-CM

## 2017-10-05 DIAGNOSIS — R42 Dizziness and giddiness: Secondary | ICD-10-CM | POA: Diagnosis present

## 2017-10-05 LAB — I-STAT TROPONIN, ED: Troponin i, poc: 0 ng/mL (ref 0.00–0.08)

## 2017-10-05 LAB — CBC
HCT: 45.2 % (ref 39.0–52.0)
Hemoglobin: 15.4 g/dL (ref 13.0–17.0)
MCH: 29.6 pg (ref 26.0–34.0)
MCHC: 34.1 g/dL (ref 30.0–36.0)
MCV: 86.9 fL (ref 78.0–100.0)
PLATELETS: 185 10*3/uL (ref 150–400)
RBC: 5.2 MIL/uL (ref 4.22–5.81)
RDW: 13.7 % (ref 11.5–15.5)
WBC: 6.1 10*3/uL (ref 4.0–10.5)

## 2017-10-05 LAB — BASIC METABOLIC PANEL
ANION GAP: 10 (ref 5–15)
BUN: 18 mg/dL (ref 6–20)
CHLORIDE: 101 mmol/L (ref 101–111)
CO2: 26 mmol/L (ref 22–32)
CREATININE: 1.64 mg/dL — AB (ref 0.61–1.24)
Calcium: 9 mg/dL (ref 8.9–10.3)
GFR calc Af Amer: 48 mL/min — ABNORMAL LOW (ref >60)
GFR calc non Af Amer: 42 mL/min — ABNORMAL LOW (ref >60)
Glucose, Bld: 151 mg/dL — ABNORMAL HIGH (ref 65–99)
POTASSIUM: 3.3 mmol/L — AB (ref 3.5–5.1)
SODIUM: 137 mmol/L (ref 135–145)

## 2017-10-05 MED ORDER — ONDANSETRON 4 MG PO TBDP: 4.0000 mg | ORAL_TABLET | Freq: Three times a day (TID) | ORAL | 0 refills | Status: AC | PRN

## 2017-10-05 MED ORDER — POTASSIUM CHLORIDE CRYS ER 20 MEQ PO TBCR
40.0000 meq | EXTENDED_RELEASE_TABLET | Freq: Once | ORAL | Status: AC
  Administered 2017-10-05: 40 meq via ORAL
  Filled 2017-10-05: qty 2

## 2017-10-05 MED ORDER — ONDANSETRON 4 MG PO TBDP
4.0000 mg | ORAL_TABLET | Freq: Once | ORAL | Status: AC
  Administered 2017-10-05: 4 mg via ORAL
  Filled 2017-10-05: qty 1

## 2017-10-05 MED ORDER — MECLIZINE HCL 25 MG PO TABS: 25.0000 mg | ORAL_TABLET | Freq: Three times a day (TID) | ORAL | 0 refills | Status: DC | PRN

## 2017-10-05 MED ORDER — DIAZEPAM 5 MG PO TABS: 5.0000 mg | ORAL_TABLET | Freq: Three times a day (TID) | ORAL | 0 refills | Status: DC | PRN

## 2017-10-05 MED ORDER — DIAZEPAM 5 MG PO TABS
5.0000 mg | ORAL_TABLET | Freq: Once | ORAL | Status: AC
  Administered 2017-10-05: 5 mg via ORAL
  Filled 2017-10-05: qty 1

## 2017-10-05 NOTE — ED Provider Notes (Signed)
[facility] EMERGENCY DEPARTMENT Provider Note   CSN: [id number] Arrival date & time: 10/05/17  1003     History   Chief Complaint No chief complaint on file.   HPI Micheal Wood is a 67 y.o. male with past medical history significant for hypertension, diabetes (diet-controlled) presenting with sudden onset dizziness at 3 AM this morning.  Patient explains that he was lying in bed on his back and felt as though he was falling.  As he attempted to turn over he felt as though he was being spun back to his original position and the room was spinning all around him.  His symptoms are aggravated by any movement and associated with nausea vomiting.  She reports improvement when he stays still.  He has not tried anything for his symptoms.  He denies any prior history of the same.  No chest pain, shortness of breath, no exertional symptoms, no headache, visual disturbances, weakness, numbness, loss of balance or heaviness in his extremity.   HPI  Past Medical History:  Diagnosis Date  . Arthritis    DDD  . Diabetes mellitus without complication (HCC)   . GERD (gastroesophageal reflux disease)   . Hypertension   . Narrowing of intervertebral disc space   . Sleep apnea    CPAP    There are no active problems to display for this patient.   Past Surgical History:  Procedure Laterality Date  . u     removal of cysts x 3, chest x 2, wrists  . uvula removed          Home Medications    Prior to Admission medications   Medication Sig Start Date End Date Taking? Authorizing Provider  acetaminophen-codeine (TYLENOL #4) 300-60 MG tablet Take 1 tablet by mouth every 4 (four) hours as needed for moderate pain. 04/22/17   Kerrin Champagne, MD  amLODipine (NORVASC) 10 MG tablet Take 10 mg by mouth daily.    [provider]  Ascorbic Acid (VITAMIN C PO) Take 1 packet by mouth daily.    [provider]  aspirin EC 81 MG tablet Take 81 mg by mouth daily.     [provider]  atenolol (TENORMIN) 25 MG tablet Take 25 mg by mouth daily.    [provider]  B Complex Vitamins (VITAMIN-B COMPLEX) TABS Take 1 tablet by mouth daily.    [provider]  baclofen (LIORESAL) 10 MG tablet Take 1 tablet (10 mg total) by mouth 3 (three) times daily. 04/22/17   Kerrin Champagne, MD  Calcium Carbonate-Vitamin D3 (CALCIUM 600-D) 600-400 MG-UNIT TABS Take 1 tablet by mouth 2 (two) times daily.    [provider]  carboxymethylcellulose 1 % ophthalmic solution Apply 1 drop to eye 2 (two) times daily.    [provider]  chlorthalidone (HYGROTON) 50 MG tablet Take 25 mg by mouth daily.    [provider]  Coenzyme Q10 (COQ10 PO) Take 1 capsule by mouth daily.    [provider]  diazepam (VALIUM) 5 MG tablet Take 1 tablet (5 mg total) by mouth every 8 (eight) hours as needed (SEVERE Vertigo). 10/05/17   Georgiana Shore, PA-C  FLUoxetine (PROZAC) 20 MG capsule Take 20 mg by mouth daily as needed (anxiety).    [provider]  fluticasone (FLONASE) 50 MCG/ACT nasal spray Place 1 spray into both nostrils at bedtime.    [provider]  gabapentin (NEURONTIN) 300 MG capsule Take 300 mg  by mouth at bedtime.  01/22/15   [provider]  Isopropyl Alcohol (SWIM EAR OT) Place 4-5 drops into both ears daily.    [provider]  ketoconazole (NIZORAL) 2 % shampoo Apply 1 application topically daily as needed (tinea versicolor on back).    [provider]  losartan (COZAAR) 50 MG tablet Take 50 mg by mouth daily.    [provider]  Lysine 1000 MG TABS Take 1,000 mg by mouth 3 (three) times a week.    [provider]  meclizine (ANTIVERT) 25 MG tablet Take 1 tablet (25 mg total) by mouth 3 (three) times daily as needed for dizziness (mild vertigo symptoms). 10/05/17   Mathews Robinsons B, PA-C  Menthol, Topical Analgesic, (ICY HOT EX) Apply 1 application  topically daily as needed (pain).    [provider]  omeprazole (PRILOSEC) 20 MG capsule Take 20 mg by mouth See admin instructions. Take 1 capsule (20 mg) by mouth daily at bedtime, may also take 1 capsule during the day as needed for acid reflux    [provider]  ondansetron (ZOFRAN ODT) 4 MG disintegrating tablet Take 1 tablet (4 mg total) by mouth every 8 (eight) hours as needed for nausea or vomiting. 10/05/17   Mathews Robinsons B, PA-C  OVER THE COUNTER MEDICATION Place 1 application into both eyes at bedtime as needed (dry eyes from [employer]). Lubricating eye ointment from [place] - apply a small ribbon to each eye    [provider]  OVER THE COUNTER MEDICATION Take 2 capsules by mouth daily. Omega xl    [provider]  OVER THE COUNTER MEDICATION Take by mouth See admin instructions. Stimulate fitness formula - 1 capsule daytime formula every morning and 1 capsule night time every night    [provider]  OVER THE COUNTER MEDICATION Take 1 capsule by mouth 2 (two) times daily. Weider Prime Testosterone Support    [provider]  OVER THE COUNTER MEDICATION Take 1 tablet by mouth 3 (three) times a week. Focus factor (brain nutrition)    [provider]  PRESCRIPTION MEDICATION Inhale into the lungs at bedtime. BIPAP    [provider]  sildenafil (VIAGRA) 100 MG tablet Take 50 mg by mouth daily as needed for erectile dysfunction.    [provider]  vitamin E 400 UNIT capsule Take 400 Units by mouth daily.    [provider]    Family History Family History  Problem Relation Age of Onset  . Hypertension Mother   . Ulcers Mother   . Hypertension Brother   . Cancer Brother   . Cancer Sister   . Stroke Paternal Grandmother     Social History Social History   Tobacco Use  . Smoking status: Never Smoker  . Smokeless tobacco: Never Used  Substance Use Topics  . Alcohol use: No  . Drug use: No      Allergies   Contrast media [iodinated diagnostic agents]; Fosinopril; and Lisinopril   Review of Systems Review of Systems  Constitutional: Negative for chills, diaphoresis, fatigue and fever.  HENT: Positive for hearing loss, sinus pressure and tinnitus. Negative for ear pain and trouble swallowing.        Patient reports mild frontal pressure.  He also reports baseline tinnitus from [employer] background, states that it is aggravated when he does experience a vertigo and he also experiences decrease in hearing which all improved when the vertigo resolves.  Eyes: Negative for  photophobia, pain, redness and visual disturbance.  Respiratory: Negative for cough, choking, chest tightness, shortness of breath, wheezing and stridor.   Cardiovascular: Negative for chest pain and palpitations.  Gastrointestinal: Positive for nausea and vomiting. Negative for abdominal distention, abdominal pain and diarrhea.  Genitourinary: Negative for dysuria, flank pain and hematuria.  Musculoskeletal: Negative for arthralgias, back pain, gait problem, joint swelling, myalgias, neck pain and neck stiffness.  Skin: Negative for color change, pallor and rash.  Neurological: Positive for dizziness and light-headedness. Negative for tremors, seizures, syncope, facial asymmetry, speech difficulty, weakness, numbness and headaches.     Physical Exam Updated Vital Signs BP 133/88   Pulse (!) 58   Temp 98.1 F (36.7 C) (Oral)   Resp 18   SpO2 100%   Physical Exam  Constitutional: He is oriented to person, place, and time. He appears well-developed and well-nourished. No distress.  Afebrile, nontoxic-appearing, lying comfortably in bed no acute distress.  HENT:  Head: Normocephalic and atraumatic.  Right Ear: External ear normal.  Left Ear: External ear normal.  Nose: Nose normal.  Mouth/Throat: Oropharynx is clear and moist. No oropharyngeal exudate.  Eyes: Pupils are equal, round, and reactive to  light. Conjunctivae and EOM are normal. Right eye exhibits no discharge. Left eye exhibits no discharge.  Nystagmus when head turned to the right during Dix-Hallpike. Delay in onset and duration approximately 40seconds  Neck: Normal range of motion. Neck supple.  Cardiovascular: Normal rate, regular rhythm, normal heart sounds and intact distal pulses.  No murmur heard. Pulmonary/Chest: Effort normal and breath sounds normal. No stridor. No respiratory distress. He has no wheezes. He has no rales.  Abdominal: He exhibits no distension.  Musculoskeletal: Normal range of motion. He exhibits no edema, tenderness or deformity.  Neurological: He is alert and oriented to person, place, and time. No cranial nerve deficit or sensory deficit. He exhibits normal muscle tone. Coordination normal.  Neurologic Exam:  - Mental status: Patient is alert and cooperative. Fluent speech and words are clear. Coherent thought processes and insight is good. Patient is oriented x 4 to person, place, time and event.  - Cranial nerves:  CN III, IV, VI: pupils equally round, reactive to light both direct and conscensual. Full extra-ocular movement. CN V: motor temporalis and masseter strength intact. CN VII : muscles of facial expression intact. CN X :  midline uvula. XI strength of sternocleidomastoid and trapezius muscles 5/5, XII: tongue is midline when protruded. - Motor: No involuntary movements. Muscle tone and bulk normal throughout. Muscle strength is 5/5 in bilateral shoulder abduction, elbow flexion and extension, grip, hip extension, flexion, leg flexion and extension, ankle dorsiflexion and plantar flexion.  - Sensory:light tough sensation intact in all extremities.  - Cerebellar: rapid alternating movements and point to point movement intact in upper and lower extremities. Normal stance and gait.  Skin: Skin is warm and dry. No rash noted. He is not diaphoretic. No erythema. No pallor.  Psychiatric: He has a  normal mood and affect.  Nursing note and vitals reviewed.    ED Treatments / Results  Labs (all labs ordered are listed, but only abnormal results are displayed) Labs Reviewed  BASIC METABOLIC PANEL - Abnormal; Notable for the following components:      Result Value   Potassium 3.3 (*)    Glucose, Bld 151 (*)    Creatinine, Ser 1.64 (*)    GFR calc non Af Amer 42 (*)    GFR calc Af Amer 48 (*)  All other components within normal limits  CBC  I-STAT TROPONIN, ED    EKG EKG Interpretation  Date/Time:  Saturday October 05 2017 16:30:20 EDT Ventricular Rate:  64 PR Interval:    QRS Duration: 81 QT Interval:  391 QTC Calculation: 404 R Axis:   74 Text Interpretation:  Sinus arrhythmia No significant change since last tracing Confirmed by Shaune PollackIsaacs, Cameron [phone]([id number]) on 10/05/2017 4:43:53 PM   Radiology Ct Head Wo Contrast  Result Date: 10/05/2017 CLINICAL DATA:  Vertigo.  Extreme dizziness.  No reported injury. EXAM: CT HEAD WITHOUT CONTRAST TECHNIQUE: Contiguous axial images were obtained from the base of the skull through the vertex without intravenous contrast. COMPARISON:  03/03/2016 head CT. FINDINGS: Brain: No evidence of parenchymal hemorrhage or extra-axial fluid collection. No mass lesion, mass effect, or midline shift. No CT evidence of acute infarction. Cerebral volume is age appropriate. No ventriculomegaly. Vascular: No acute abnormality. Skull: No evidence of calvarial fracture. Sinuses/Orbits: The visualized paranasal sinuses are essentially clear. Other: The mastoid air cells are unopacified. Partially visualized surgical hardware from ACDF in the cervical spine. IMPRESSION: Negative head CT.  No evidence of acute intracranial abnormality. Electronically Signed   By: Delbert PhenixJason A Poff M.D.   On: 10/05/2017 17:30    Procedures Procedures (including critical care time)  Epley maneuver  Medications Ordered in ED Medications  diazepam (VALIUM) tablet 5 mg (5 mg Oral Given  10/05/17 1721)  ondansetron (ZOFRAN-ODT) disintegrating tablet 4 mg (4 mg Oral Given 10/05/17 1722)  potassium chloride SA (K-DUR,KLOR-CON) CR tablet 40 mEq (40 mEq Oral Given 10/05/17 1820)     Initial Impression / Assessment and Plan / ED Course  I have reviewed the triage vital signs and the nursing notes.  Pertinent labs & imaging results that were available during my care of the patient were reviewed by me and considered in my medical decision making (see chart for details).    Patient presentst with dizziness that started at 3 am this morning. He describes it as a sensation that the room is spinning all him and his symptoms are aggravated by movement.  He has been experiencing nausea and vomiting with this.  Denies headache, visual disturbances, chest pain or shortness of breath.  Reassuring exam, normal neuro. Right sided nystagmus with lag during Dix-Hallpike maneuver resolving after ~ 40seconds.   CT head negative Labs and EKG unremarkable other than mild hypokalemia, patient was given potassium while in the emergency department.  Patient was given Valium and Zofran in the emergency department.  He requested food and patient is able to eat and no vomiting while in the ER.   On reassessment, he was sitting comfortably in bed eating without symptoms until turning in bed or moving his head.  Performed Epley maneuver and patient reported improvement. Patient was provided with instructions to do this at home and ENT follow up as needed. Will discharge home with symptomatic relief.  Discussed strict return precautions and advised to return to the emergency department if experiencing any new or worsening symptoms. Instructions were understood and patient agreed with discharge plan. Final Clinical Impressions(s) / ED Diagnoses   Final diagnoses:  BPPV (benign paroxysmal positional vertigo), right  Hypokalemia    ED Discharge Orders        Ordered    ondansetron (ZOFRAN ODT) 4 MG  disintegrating tablet  Every 8 hours PRN     10/05/17 1947    diazepam (VALIUM) 5 MG tablet  Every 8 hours PRN  10/05/17 1947    meclizine (ANTIVERT) 25 MG tablet  3 times daily PRN     10/05/17 1947       Gregary Cromer 10/05/17 2016    Shaune Pollack, MD 10/06/17 1121

## 2017-10-05 NOTE — ED Notes (Signed)
ED Provider at bedside. 

## 2017-10-05 NOTE — ED Provider Notes (Signed)
Medical screening examination/treatment/procedure(s) were conducted as a shared visit with non-physician practitioner(s) and myself.  I personally evaluated the patient during the encounter. Briefly, the patient is a 67 yo M here with likely peripheral vertigo. No dysphagia, visual changes, dysarthria, or other sx of central cause. Sx are reproducible on Dix-Hallpike with fatigued nystagmus with latent onset.  HINTS testing is consistent with peripheral vertigo. CT Head ordered given age, but will tx as peripheral etiology and re-assess. Pt in agreement. Pt has h/o chronic tinnitus/inner ear dysfunction which I suspect is contributing to his sx. Will need ENT follow-up.   EKG Interpretation  Date/Time:  Saturday October 05 2017 16:30:20 EDT Ventricular Rate:  64 PR Interval:    QRS Duration: 81 QT Interval:  391 QTC Calculation: 404 R Axis:   74 Text Interpretation:  Sinus arrhythmia No significant change since last tracing Confirmed by Shaune PollackIsaacs, Arline Ketter [phone] on 10/05/2017 4:43:53 PM          Shaune PollackIsaacs, Sharicka Pogorzelski, MD 10/05/17 [phone]

## 2017-10-05 NOTE — Discharge Instructions (Signed)
As discussed, your symptoms and physical exam were consistent with what is called benign paroxysmal positional vertigo. The CT scan of your head was negative for mass lesion or acute abnormalities, your potassium was slightly low and you were given potassium while in the emergency department.  Your EKG was unremarkable and unchanged from prior, your heart enzyme was negative.  Together we performed the Epley maneuver to help restore the position of the small crystal traveling in your inner ear causing sensation of movement that is not occurring.  Try to avoid abrupt movements of the head for the next 24 hours and get some rest. Take your medications as needed if symptomatic. Follow-up with ENT if symptoms return or are persistent.    Return to the emergency department if experiencing a pounding headache, visual changes, weakness or numbness in your extremities or any other new concerning symptoms in the meantime.

## 2017-10-05 NOTE — ED Notes (Signed)
Patient transported to CT 

## 2017-10-05 NOTE — ED Triage Notes (Signed)
Patient complains of intermittent dizziness since yesterday, describes as positional dizziness with vomiting. On arrival describes as mild and NAD

## 2017-10-30 ENCOUNTER — Ambulatory Visit (INDEPENDENT_AMBULATORY_CARE_PROVIDER_SITE_OTHER): Payer: Medicare Other | Admitting: Specialist

## 2017-10-30 ENCOUNTER — Encounter (INDEPENDENT_AMBULATORY_CARE_PROVIDER_SITE_OTHER): Payer: Self-pay | Admitting: Specialist

## 2017-10-30 VITALS — BP 113/76 | HR 62 | Ht 67.0 in | Wt 188.0 lb

## 2017-10-30 DIAGNOSIS — M4712 Other spondylosis with myelopathy, cervical region: Secondary | ICD-10-CM | POA: Diagnosis not present

## 2017-10-30 DIAGNOSIS — M47816 Spondylosis without myelopathy or radiculopathy, lumbar region: Secondary | ICD-10-CM

## 2017-10-30 DIAGNOSIS — M48062 Spinal stenosis, lumbar region with neurogenic claudication: Secondary | ICD-10-CM | POA: Diagnosis not present

## 2017-10-30 DIAGNOSIS — M4322 Fusion of spine, cervical region: Secondary | ICD-10-CM | POA: Diagnosis not present

## 2017-10-30 MED ORDER — GABAPENTIN 300 MG PO CAPS
300.0000 mg | ORAL_CAPSULE | Freq: Every day | ORAL | 3 refills | Status: AC
Start: 2017-10-30 — End: ?

## 2017-10-30 MED ORDER — BACLOFEN 10 MG PO TABS: 10.0000 mg | ORAL_TABLET | Freq: Three times a day (TID) | ORAL | 0 refills | Status: AC

## 2017-10-30 NOTE — Progress Notes (Signed)
Office Visit Note   Patient: Micheal PulseLarry Wood           Date of Birth: 09/30/1950           MRN: [id number] Visit Date: 10/30/2017              Requested by: No referring provider defined for this encounter. [facility]   Assessment & Plan: Visit Diagnoses: No diagnosis found.  Plan: Avoid bending, stooping and avoid lifting weights greater than 10 lbs. Avoid prolong standing and walking. Avoid frequent bending and stooping  No lifting greater than 10 lbs. May use ice or moist heat for pain. Weight loss is of benefit. Handicap license is approved. Avoid overhead lifting and overhead use of the arms. Do not lift greater than 5 lbs. Adjust head rest in vehicle to prevent hyperextension if rear ended. Take extra precautions to avoid falling, including use of a cane if you feel weak.  Follow-Up Instructions: No follow-ups on file.   Orders:  No orders of the defined types were placed in this encounter.  No orders of the defined types were placed in this encounter.     Procedures: No procedures performed   Clinical Data: No additional findings.   Subjective: Chief Complaint  Patient presents with  . Neck - Follow-up  . 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 Has problem with night pain before going to bed and stiffness into the lower back. He is low on baclofen which helps with the muscle spasm. He is able to walk for exercise. No bowel or bladder difficulties. Has some right arm numbness and tingling into all right 4 fingers, not much into the right thumb. Told this may take longer to go away.    Review of Systems  Constitutional: Negative.   HENT: Negative.   Eyes: Negative.   Respiratory: Negative.   Cardiovascular: Negative.   Gastrointestinal: Negative.   Endocrine: Negative.   Genitourinary: Negative.   Musculoskeletal: Negative.   Skin:  Negative.   Allergic/Immunologic: Negative.   Neurological: Negative.   Hematological: Negative.   Psychiatric/Behavioral: Negative.      Objective: Vital Signs: BP 113/76 (BP Location: Left Arm, Patient Position: Sitting)   Wood 62   Ht 5\' 7"  (1.702 m)   Wt 188 lb (85.3 kg)   BMI 29.44 kg/m   Physical Exam  Constitutional: He is oriented to person, place, and time. He appears well-developed and well-nourished.  HENT:  Head: Normocephalic and atraumatic.  Eyes: Pupils are equal, round, and reactive to light. EOM are normal.  Neck: Normal range of motion. Neck supple.  Pulmonary/Chest: Effort normal and breath sounds normal.  Abdominal: Soft. Bowel sounds are normal.  Musculoskeletal: Normal range of motion.  Neurological: He is alert and oriented to person, place, and time.  Skin: Skin is warm and dry.  Psychiatric: He has a normal mood and affect. His behavior is normal. Judgment and thought content normal.    Ortho Exam  Specialty Comments:  No specialty comments available.  Imaging: No results found.   PMFS History: There are no active problems to display for this patient.  Past Medical History:  Diagnosis Date  . Arthritis    DDD  . Diabetes mellitus without complication (HCC)   . GERD (gastroesophageal reflux disease)   . Hypertension   . Narrowing of intervertebral disc space   .  Sleep apnea    CPAP    Family History  Problem Relation Age of Onset  . Hypertension Mother   . Ulcers Mother   . Hypertension Brother   . Cancer Brother   . Cancer Sister   . Stroke Paternal Grandmother     Past Surgical History:  Procedure Laterality Date  . u     removal of cysts x 3, chest x 2, wrists  . uvula removed     Social History   Occupational History  . Occupation: retired    Comment: [employer], 20+ years  Tobacco Use  . Smoking status: Never Smoker  . Smokeless tobacco: Never Used  Substance and Sexual Activity  . Alcohol use: No  . Drug use: No   . Sexual activity: Not on file

## 2017-10-30 NOTE — Patient Instructions (Signed)
Avoid bending, stooping and avoid lifting weights greater than 10 lbs. Avoid prolong standing and walking. Avoid frequent bending and stooping  No lifting greater than 10 lbs. May use ice or moist heat for pain. Weight loss is of benefit. Handicap license is approved. Avoid overhead lifting and overhead use of the arms. Do not lift greater than 5 lbs. Adjust head rest in vehicle to prevent hyperextension if rear ended. Take extra precautions to avoid falling, including use of a cane if you feel weak.    

## 2017-11-17 ENCOUNTER — Other Ambulatory Visit: Payer: Self-pay

## 2017-11-17 ENCOUNTER — Encounter (HOSPITAL_COMMUNITY): Payer: Self-pay | Admitting: Emergency Medicine

## 2017-11-17 ENCOUNTER — Inpatient Hospital Stay (HOSPITAL_COMMUNITY)
Admission: EM | Admit: 2017-11-17 | Discharge: 2017-11-19 | DRG: 690 | Disposition: A | Discharge status: Home / Self Care | Payer: Medicare Other | Attending: Internal Medicine | Admitting: Internal Medicine

## 2017-11-17 ENCOUNTER — Emergency Department (HOSPITAL_COMMUNITY): Payer: Medicare Other

## 2017-11-17 DIAGNOSIS — Z809 Family history of malignant neoplasm, unspecified: Secondary | ICD-10-CM

## 2017-11-17 DIAGNOSIS — Z1612 Extended spectrum beta lactamase (ESBL) resistance: Secondary | ICD-10-CM | POA: Diagnosis present

## 2017-11-17 DIAGNOSIS — Z7951 Long term (current) use of inhaled steroids: Secondary | ICD-10-CM

## 2017-11-17 DIAGNOSIS — N12 Tubulo-interstitial nephritis, not specified as acute or chronic: Principal | ICD-10-CM | POA: Diagnosis present

## 2017-11-17 DIAGNOSIS — Z8249 Family history of ischemic heart disease and other diseases of the circulatory system: Secondary | ICD-10-CM | POA: Diagnosis not present

## 2017-11-17 DIAGNOSIS — Z7982 Long term (current) use of aspirin: Secondary | ICD-10-CM

## 2017-11-17 DIAGNOSIS — E1122 Type 2 diabetes mellitus with diabetic chronic kidney disease: Secondary | ICD-10-CM | POA: Diagnosis present

## 2017-11-17 DIAGNOSIS — K219 Gastro-esophageal reflux disease without esophagitis: Secondary | ICD-10-CM | POA: Diagnosis present

## 2017-11-17 DIAGNOSIS — I129 Hypertensive chronic kidney disease with stage 1 through stage 4 chronic kidney disease, or unspecified chronic kidney disease: Secondary | ICD-10-CM | POA: Diagnosis present

## 2017-11-17 DIAGNOSIS — Z888 Allergy status to other drugs, medicaments and biological substances status: Secondary | ICD-10-CM | POA: Diagnosis not present

## 2017-11-17 DIAGNOSIS — B9620 Unspecified Escherichia coli [E. coli] as the cause of diseases classified elsewhere: Secondary | ICD-10-CM | POA: Diagnosis present

## 2017-11-17 DIAGNOSIS — N179 Acute kidney failure, unspecified: Secondary | ICD-10-CM | POA: Diagnosis present

## 2017-11-17 DIAGNOSIS — M1990 Unspecified osteoarthritis, unspecified site: Secondary | ICD-10-CM | POA: Diagnosis present

## 2017-11-17 DIAGNOSIS — Z91041 Radiographic dye allergy status: Secondary | ICD-10-CM | POA: Diagnosis not present

## 2017-11-17 DIAGNOSIS — I959 Hypotension, unspecified: Secondary | ICD-10-CM | POA: Diagnosis present

## 2017-11-17 DIAGNOSIS — Z87440 Personal history of urinary (tract) infections: Secondary | ICD-10-CM | POA: Diagnosis not present

## 2017-11-17 DIAGNOSIS — N182 Chronic kidney disease, stage 2 (mild): Secondary | ICD-10-CM | POA: Diagnosis present

## 2017-11-17 DIAGNOSIS — Z87891 Personal history of nicotine dependence: Secondary | ICD-10-CM | POA: Diagnosis not present

## 2017-11-17 DIAGNOSIS — G4730 Sleep apnea, unspecified: Secondary | ICD-10-CM | POA: Diagnosis present

## 2017-11-17 DIAGNOSIS — R0982 Postnasal drip: Secondary | ICD-10-CM | POA: Diagnosis not present

## 2017-11-17 DIAGNOSIS — E872 Acidosis: Secondary | ICD-10-CM | POA: Diagnosis not present

## 2017-11-17 DIAGNOSIS — Z823 Family history of stroke: Secondary | ICD-10-CM

## 2017-11-17 DIAGNOSIS — Z79899 Other long term (current) drug therapy: Secondary | ICD-10-CM | POA: Diagnosis not present

## 2017-11-17 DIAGNOSIS — E860 Dehydration: Secondary | ICD-10-CM | POA: Diagnosis present

## 2017-11-17 LAB — URINALYSIS, ROUTINE W REFLEX MICROSCOPIC
BILIRUBIN URINE: NEGATIVE
Glucose, UA: NEGATIVE mg/dL
Ketones, ur: NEGATIVE mg/dL
Nitrite: NEGATIVE
PROTEIN: 30 mg/dL — AB
Specific Gravity, Urine: 1.011 (ref 1.005–1.030)
pH: 5 (ref 5.0–8.0)

## 2017-11-17 LAB — COMPREHENSIVE METABOLIC PANEL
ALK PHOS: 101 U/L (ref 38–126)
ALT: 44 U/L (ref 17–63)
AST: 48 U/L — AB (ref 15–41)
Albumin: 2.9 g/dL — ABNORMAL LOW (ref 3.5–5.0)
Anion gap: 11 (ref 5–15)
BILIRUBIN TOTAL: 1.5 mg/dL — AB (ref 0.3–1.2)
BUN: 36 mg/dL — AB (ref 6–20)
CALCIUM: 8 mg/dL — AB (ref 8.9–10.3)
CO2: 24 mmol/L (ref 22–32)
CREATININE: 3.29 mg/dL — AB (ref 0.61–1.24)
Chloride: 99 mmol/L — ABNORMAL LOW (ref 101–111)
GFR calc Af Amer: 21 mL/min — ABNORMAL LOW (ref >60)
GFR calc non Af Amer: 18 mL/min — ABNORMAL LOW (ref >60)
GLUCOSE: 170 mg/dL — AB (ref 65–99)
Potassium: 3.2 mmol/L — ABNORMAL LOW (ref 3.5–5.1)
Sodium: 134 mmol/L — ABNORMAL LOW (ref 135–145)
TOTAL PROTEIN: 6.7 g/dL (ref 6.5–8.1)

## 2017-11-17 LAB — CBC WITH DIFFERENTIAL/PLATELET
BASOS PCT: 0 %
Basophils Absolute: 0 10*3/uL (ref 0.0–0.1)
Eosinophils Absolute: 0 10*3/uL (ref 0.0–0.7)
Eosinophils Relative: 0 %
HCT: 37.6 % — ABNORMAL LOW (ref 39.0–52.0)
HEMOGLOBIN: 13.1 g/dL (ref 13.0–17.0)
LYMPHS ABS: 1.2 10*3/uL (ref 0.7–4.0)
LYMPHS PCT: 9 %
MCH: 29.5 pg (ref 26.0–34.0)
MCHC: 34.8 g/dL (ref 30.0–36.0)
MCV: 84.7 fL (ref 78.0–100.0)
Monocytes Absolute: 0.6 10*3/uL (ref 0.1–1.0)
Monocytes Relative: 5 %
NEUTROS ABS: 11 10*3/uL — AB (ref 1.7–7.7)
Neutrophils Relative %: 86 %
Platelets: 178 10*3/uL (ref 150–400)
RBC: 4.44 MIL/uL (ref 4.22–5.81)
RDW: 13.8 % (ref 11.5–15.5)
WBC: 12.8 10*3/uL — ABNORMAL HIGH (ref 4.0–10.5)

## 2017-11-17 LAB — CBC
HEMATOCRIT: 34.4 % — AB (ref 39.0–52.0)
HEMOGLOBIN: 12 g/dL — AB (ref 13.0–17.0)
MCH: 29.5 pg (ref 26.0–34.0)
MCHC: 34.9 g/dL (ref 30.0–36.0)
MCV: 84.5 fL (ref 78.0–100.0)
Platelets: 162 10*3/uL (ref 150–400)
RBC: 4.07 MIL/uL — ABNORMAL LOW (ref 4.22–5.81)
RDW: 14 % (ref 11.5–15.5)
WBC: 10.5 10*3/uL (ref 4.0–10.5)

## 2017-11-17 LAB — HEMOGLOBIN A1C
HEMOGLOBIN A1C: 6.3 % — AB (ref 4.8–5.6)
Mean Plasma Glucose: 134.11 mg/dL

## 2017-11-17 LAB — I-STAT CG4 LACTIC ACID, ED: Lactic Acid, Venous: 1.49 mmol/L (ref 0.5–1.9)

## 2017-11-17 LAB — CREATININE, SERUM
CREATININE: 3.06 mg/dL — AB (ref 0.61–1.24)
GFR calc Af Amer: 23 mL/min — ABNORMAL LOW (ref >60)
GFR calc non Af Amer: 20 mL/min — ABNORMAL LOW (ref >60)

## 2017-11-17 MED ORDER — ENOXAPARIN SODIUM 30 MG/0.3ML ~~LOC~~ SOLN: 30.0000 mg | SUBCUTANEOUS | Status: DC

## 2017-11-17 MED ORDER — POTASSIUM CHLORIDE CRYS ER 20 MEQ PO TBCR
20.0000 meq | EXTENDED_RELEASE_TABLET | ORAL | Status: AC
  Administered 2017-11-17 (×2): 20 meq via ORAL
  Filled 2017-11-17 (×2): qty 1

## 2017-11-17 MED ORDER — ONDANSETRON HCL 4 MG/2ML IJ SOLN: 4.0000 mg | Freq: Four times a day (QID) | INTRAMUSCULAR | Status: DC | PRN

## 2017-11-17 MED ORDER — ASPIRIN 81 MG PO CHEW
CHEWABLE_TABLET | ORAL | Status: AC
  Administered 2017-11-17: 81 mg
  Filled 2017-11-17: qty 1

## 2017-11-17 MED ORDER — SODIUM CHLORIDE 0.9% FLUSH
3.0000 mL | Freq: Two times a day (BID) | INTRAVENOUS | Status: DC
  Administered 2017-11-17 – 2017-11-18 (×2): 3 mL via INTRAVENOUS

## 2017-11-17 MED ORDER — SODIUM CHLORIDE 0.9 % IV BOLUS
2000.0000 mL | Freq: Once | INTRAVENOUS | Status: AC
  Administered 2017-11-17: 2000 mL via INTRAVENOUS

## 2017-11-17 MED ORDER — ASPIRIN EC 81 MG PO TBEC
81.0000 mg | DELAYED_RELEASE_TABLET | Freq: Every day | ORAL | Status: DC
  Administered 2017-11-18 – 2017-11-19 (×2): 81 mg via ORAL
  Filled 2017-11-17 (×2): qty 1

## 2017-11-17 MED ORDER — PANTOPRAZOLE SODIUM 40 MG PO TBEC
40.0000 mg | DELAYED_RELEASE_TABLET | Freq: Every day | ORAL | Status: DC
  Administered 2017-11-18 – 2017-11-19 (×2): 40 mg via ORAL
  Filled 2017-11-17 (×2): qty 1

## 2017-11-17 MED ORDER — ACETAMINOPHEN 650 MG RE SUPP
650.0000 mg | Freq: Four times a day (QID) | RECTAL | Status: DC | PRN
  Administered 2017-11-18: 650 mg via RECTAL

## 2017-11-17 MED ORDER — POLYVINYL ALCOHOL 1.4 % OP SOLN
1.0000 [drp] | Freq: Two times a day (BID) | OPHTHALMIC | Status: DC
Start: 2017-11-17 — End: 2017-11-19
  Administered 2017-11-17 – 2017-11-18 (×3): 1 [drp] via OPHTHALMIC
  Filled 2017-11-17: qty 15

## 2017-11-17 MED ORDER — FLUOXETINE HCL 20 MG PO CAPS
20.0000 mg | ORAL_CAPSULE | Freq: Every day | ORAL | Status: DC | PRN
  Filled 2017-11-17: qty 1

## 2017-11-17 MED ORDER — POLYETHYLENE GLYCOL 3350 17 G PO PACK: 17.0000 g | PACK | Freq: Every day | ORAL | Status: DC | PRN

## 2017-11-17 MED ORDER — SODIUM CHLORIDE 0.9 % IV SOLN
1.0000 g | Freq: Two times a day (BID) | INTRAVENOUS | Status: DC
  Administered 2017-11-17 – 2017-11-19 (×4): 1 g via INTRAVENOUS
  Filled 2017-11-17 (×6): qty 1

## 2017-11-17 MED ORDER — SODIUM CHLORIDE 0.9 % IV SOLN
INTRAVENOUS | Status: DC
  Administered 2017-11-17 – 2017-11-19 (×4): via INTRAVENOUS

## 2017-11-17 MED ORDER — PIPERACILLIN-TAZOBACTAM 3.375 G IVPB 30 MIN
3.3750 g | Freq: Once | INTRAVENOUS | Status: AC
  Administered 2017-11-17: 3.375 g via INTRAVENOUS
  Filled 2017-11-17: qty 50

## 2017-11-17 MED ORDER — ACETAMINOPHEN 325 MG PO TABS
650.0000 mg | ORAL_TABLET | Freq: Four times a day (QID) | ORAL | Status: DC | PRN
  Administered 2017-11-17 – 2017-11-18 (×2): 650 mg via ORAL
  Filled 2017-11-17 (×2): qty 2

## 2017-11-17 MED ORDER — GABAPENTIN 300 MG PO CAPS
300.0000 mg | ORAL_CAPSULE | Freq: Every day | ORAL | Status: DC
  Administered 2017-11-17 – 2017-11-18 (×2): 300 mg via ORAL
  Filled 2017-11-17 (×2): qty 1

## 2017-11-17 MED ORDER — ENOXAPARIN SODIUM 30 MG/0.3ML ~~LOC~~ SOLN
30.0000 mg | Freq: Every day | SUBCUTANEOUS | Status: DC
  Administered 2017-11-18 – 2017-11-19 (×2): 30 mg via SUBCUTANEOUS
  Filled 2017-11-17 (×2): qty 0.3

## 2017-11-17 MED ORDER — ONDANSETRON HCL 4 MG PO TABS: 4.0000 mg | ORAL_TABLET | Freq: Four times a day (QID) | ORAL | Status: DC | PRN

## 2017-11-17 NOTE — Progress Notes (Signed)
Pharmacy Antibiotic Note  Micheal Wood is a 67 y.o. male admitted on 11/17/2017 with UTI.  Pharmacy has been consulted for meropenem dosing. Pt is afebrile and WBC is elevated at 12.8. SCr is elevated above baseline at 3.29. Pt has not improved on outpatient antibiotics.   Plan: Meropenem 1gm IV Q12H F/u renal fxn, C&S, clinical status  Height:  (170.2 cm) Weight: 188 lb (85.3 kg) IBW/kg (Calculated) : 66.1  Temp (24hrs), Avg:98.6 F (37 C), Min:98.6 F (37 C), Max:98.6 F (37 C)  Recent Labs  Lab 11/17/17 1455 11/17/17 1513  WBC 12.8*  --   CREATININE 3.29*  --   LATICACIDVEN  --  1.49    Estimated Creatinine Clearance: 22.7 mL/min (A) (by C-G formula based on SCr of 3.29 mg/dL (H)).    Allergies  Allergen Reactions  . Contrast Media [Iodinated Diagnostic Agents] Other (See Comments)    Caused a "burning sensation"  . Fosinopril Swelling    Facial edema  . Ioxaglate Other (See Comments)     Contrast Dye caused a "burning sensation"  . Lisinopril Other (See Comments)    Patient stated that it made him feel like he was losing his mind  . Adhesive [Tape] Rash    Antimicrobials this admission: Meropenem 5/12>> Zosyn x 1 5/12  Dose adjustments this admission: N/A  Microbiology results: Pending  Thank you for allowing pharmacy to be a part of this patient's care.  Clarita Mcelvain, Drake Leach 11/17/2017 6:04 PM

## 2017-11-17 NOTE — ED Triage Notes (Signed)
Patient states he is undergoing treatment for a UTI for approximately 8 days, reports experiencing chills and fever. Told by PCP if chills and fever continue to go to ED. Patient reports temperature 99.30F today.

## 2017-11-17 NOTE — ED Notes (Signed)
Patient back from CT, placed back on BP and pulse ox. Requesting something to drink - will check with EDP.

## 2017-11-17 NOTE — ED Provider Notes (Signed)
[facility] EMERGENCY DEPARTMENT Provider Note   CSN: [id number] Arrival date & time: 11/17/17  1428     History   Chief Complaint Chief Complaint  Patient presents with  . Urinary Tract Infection    HPI Vincente Asbridge is a 67 y.o. male hx of DM, GERD, HTN, here with chills, dysuria. Patient was diagnosed with UTI around 8 days ago and has finished a course of cipro.  Patient states that he has persistent chills and persistent dysuria.  He went back to see his doctor yesterday and was diagnosed with recurrent urinary tract infection and given a shot of antibiotics in his buttock as well as prescribed Bactrim.  Patient states that he has worsening left flank pain since yesterday as well as chills so came in for evaluation today.  Patient denies any previous kidney stones and was noted to be hypotensive in triage.  The history is provided by the patient.    Past Medical History:  Diagnosis Date  . Arthritis    DDD  . Diabetes mellitus without complication (HCC)   . GERD (gastroesophageal reflux disease)   . Hypertension   . Narrowing of intervertebral disc space   . Sleep apnea    CPAP    There are no active problems to display for this patient.   Past Surgical History:  Procedure Laterality Date  . u     removal of cysts x 3, chest x 2, wrists  . uvula removed          Home Medications    Prior to Admission medications   Medication Sig Start Date End Date Taking? Authorizing Provider  acetaminophen-codeine (TYLENOL #4) 300-60 MG tablet Take 1 tablet by mouth every 4 (four) hours as needed for moderate pain. 04/22/17   Kerrin Champagne, MD  amLODipine (NORVASC) 10 MG tablet Take 10 mg by mouth daily.    [provider]  Ascorbic Acid (VITAMIN C PO) Take 1 packet by mouth daily.    [provider]  aspirin EC 81 MG tablet Take 81 mg by mouth daily.    [provider]  atenolol (TENORMIN) 25 MG tablet Take 25 mg by mouth  daily.    [provider]  B Complex Vitamins (VITAMIN-B COMPLEX) TABS Take 1 tablet by mouth daily.    [provider]  baclofen (LIORESAL) 10 MG tablet Take 1 tablet (10 mg total) by mouth 3 (three) times daily. 10/30/17   Kerrin Champagne, MD  Calcium Carbonate-Vitamin D3 (CALCIUM 600-D) 600-400 MG-UNIT TABS Take 1 tablet by mouth 2 (two) times daily.    [provider]  carboxymethylcellulose 1 % ophthalmic solution Apply 1 drop to eye 2 (two) times daily.    [provider]  chlorthalidone (HYGROTON) 50 MG tablet Take 25 mg by mouth daily.    [provider]  Coenzyme Q10 (COQ10 PO) Take 1 capsule by mouth daily.    [provider]  diazepam (VALIUM) 5 MG tablet Take 1 tablet (5 mg total) by mouth every 8 (eight) hours as needed (SEVERE Vertigo). 10/05/17   Georgiana Shore, PA-C  FLUoxetine (PROZAC) 20 MG capsule Take 20 mg by mouth daily as needed (anxiety).    [provider]  fluticasone (FLONASE) 50 MCG/ACT nasal spray Place 1 spray into both nostrils at bedtime.    [provider]  gabapentin (NEURONTIN) 300 MG capsule Take 1 capsule (300 mg total) by mouth at bedtime. 10/30/17   Otelia Sergeant,  Guy Sandifer, MD  Isopropyl Alcohol (SWIM EAR OT) Place 4-5 drops into both ears daily.    [provider]  ketoconazole (NIZORAL) 2 % shampoo Apply 1 application topically daily as needed (tinea versicolor on back).    [provider]  losartan (COZAAR) 50 MG tablet Take 50 mg by mouth daily.    [provider]  Lysine 1000 MG TABS Take 1,000 mg by mouth 3 (three) times a week.    [provider]  meclizine (ANTIVERT) 25 MG tablet Take 1 tablet (25 mg total) by mouth 3 (three) times daily as needed for dizziness (mild vertigo symptoms). 10/05/17   Mathews Robinsons B, PA-C  Menthol, Topical Analgesic, (ICY HOT EX) Apply 1 application topically daily as needed (pain).    [provider]  omeprazole  (PRILOSEC) 20 MG capsule Take 20 mg by mouth See admin instructions. Take 1 capsule (20 mg) by mouth daily at bedtime, may also take 1 capsule during the day as needed for acid reflux    [provider]  ondansetron (ZOFRAN ODT) 4 MG disintegrating tablet Take 1 tablet (4 mg total) by mouth every 8 (eight) hours as needed for nausea or vomiting. 10/05/17   Mathews Robinsons B, PA-C  OVER THE COUNTER MEDICATION Place 1 application into both eyes at bedtime as needed (dry eyes from BiPap). Lubricating eye ointment from [place] - apply a small ribbon to each eye    [provider]  OVER THE COUNTER MEDICATION Take 2 capsules by mouth daily. Omega xl    [provider]  OVER THE COUNTER MEDICATION Take by mouth See admin instructions. Stimulate fitness formula - 1 capsule daytime formula every morning and 1 capsule night time every night    [provider]  OVER THE COUNTER MEDICATION Take 1 capsule by mouth 2 (two) times daily. Weider Prime Testosterone Support    [provider]  OVER THE COUNTER MEDICATION Take 1 tablet by mouth 3 (three) times a week. Focus factor (brain nutrition)    [provider]  PRESCRIPTION MEDICATION Inhale into the lungs at bedtime. BIPAP    [provider]  sildenafil (VIAGRA) 100 MG tablet Take 50 mg by mouth daily as needed for erectile dysfunction.    [provider]  vitamin E 400 UNIT capsule Take 400 Units by mouth daily.    [provider]    Family History Family History  Problem Relation Age of Onset  . Hypertension Mother   . Ulcers Mother   . Hypertension Brother   . Cancer Brother   . Cancer Sister   . Stroke Paternal Grandmother     Social History Social History   Tobacco Use  . Smoking status: Never Smoker  . Smokeless tobacco: Never Used  Substance Use Topics  . Alcohol use: No  . Drug use: No     Allergies   Contrast media [iodinated diagnostic agents]; Fosinopril;  and Lisinopril   Review of Systems Review of Systems  Genitourinary: Positive for difficulty urinating, dysuria and flank pain.  All other systems reviewed and are negative.    Physical Exam Updated Vital Signs BP (!) 97/58 (BP Location: Left Arm)   Pulse (!) 103   Temp 98.6 F (37 C) (Oral)   Resp 16   SpO2 100%   Physical Exam  Constitutional: He is oriented to person, place, and time. He appears well-developed and well-nourished.  HENT:  Head: Normocephalic.  Mouth/Throat: Oropharynx is clear and  moist.  Eyes: Pupils are equal, round, and reactive to light. Conjunctivae and EOM are normal.  Neck: Normal range of motion. Neck supple.  Cardiovascular: Normal rate, regular rhythm and normal heart sounds.  Pulmonary/Chest: Effort normal and breath sounds normal. No stridor. No respiratory distress. He has no wheezes.  Abdominal: Soft. Bowel sounds are normal.  Mild L CVAT and suprapubic tenderness   Musculoskeletal: Normal range of motion. He exhibits no edema.  Neurological: He is alert and oriented to person, place, and time. No cranial nerve deficit. Coordination normal.  Skin: Skin is warm.  Psychiatric: He has a normal mood and affect.  Nursing note and vitals reviewed.    ED Treatments / Results  Labs (all labs ordered are listed, but only abnormal results are displayed) Labs Reviewed  COMPREHENSIVE METABOLIC PANEL - Abnormal; Notable for the following components:      Result Value   Sodium 134 (*)    Potassium 3.2 (*)    Chloride 99 (*)    Glucose, Bld 170 (*)    BUN 36 (*)    Creatinine, Ser 3.29 (*)    Calcium 8.0 (*)    Albumin 2.9 (*)    AST 48 (*)    Total Bilirubin 1.5 (*)    GFR calc non Af Amer 18 (*)    GFR calc Af Amer 21 (*)    All other components within normal limits  CBC WITH DIFFERENTIAL/PLATELET - Abnormal; Notable for the following components:   WBC 12.8 (*)    HCT 37.6 (*)    Neutro Abs 11.0 (*)    All other components within normal  limits  CULTURE, BLOOD (ROUTINE X 2)  CULTURE, BLOOD (ROUTINE X 2)  URINE CULTURE  URINALYSIS, ROUTINE W REFLEX MICROSCOPIC  PROTIME-INR  I-STAT CG4 LACTIC ACID, ED  I-STAT CG4 LACTIC ACID, ED    EKG None  Radiology No results found.  Procedures Procedures (including critical care time)  Medications Ordered in ED Medications  sodium chloride 0.9 % bolus 2,000 mL (2,000 mLs Intravenous New Bag/Given 11/17/17 1629)  piperacillin-tazobactam (ZOSYN) IVPB 3.375 g (3.375 g Intravenous New Bag/Given 11/17/17 1633)     Initial Impression / Assessment and Plan / ED Course  I have reviewed the triage vital signs and the nursing notes.  Pertinent labs & imaging results that were available during my care of the patient were reviewed by me and considered in my medical decision making (see chart for details).    Mosi Hannold is a 67 y.o. male here with flank pain, dysuria. He is already taking antibiotics and is hypotensive. I am concerned for sepsis from pyelo. Will initiate sepsis workup with cbc, CMP, lactate, UA and urine culture, blood cultures, CT ab/pel.   6:18 PM BP improved. Cr elevated to 3.2, baseline 1.6. UA + UTI. CT renal stone showed bilateral pyelo. Given zosyn. Internal medicine will admit.   Final Clinical Impressions(s) / ED Diagnoses   Final diagnoses:  None    ED Discharge Orders    None       Charlynne Pander, MD 11/17/17 1820

## 2017-11-17 NOTE — H&P (Signed)
Date: 11/17/2017               Patient Name:  Micheal Wood MRN: [id number]  DOB: Nov 25, 1950 Age / Sex: 67 y.o., male   [facility] [place] Service: Internal Medicine Teaching Service         Attending Physician: Dr. Lynnae January, Real Cons, MD    First Contact: Dr. Thomasene Ripple Pager: [phone]  Second Contact: Dr. Lorella Nimrod Pager: [phone]       After Hours (After 5p/  First Contact Pager: [phone]  weekends / holidays): Second Contact Pager: [phone]   Chief Complaint: Fevers, flank pain, increased urinary urgency  History of Present Illness:  Micheal Wood is 67 yo with PMH of diet controlled DM, GERD, HTN, CKD stage II, and recent UTI treated with ciprofloxacin who presents to the ED for evaluation of chills, flank pain, and worsening urinary urgency and frequency. History obtained directly from the patient. Patient states for the past week or so he has been experiencing increased urinary urgency and frequency. He denies pain with urination but states that he feels like he has to go all the time and when he urinates, not much comes out. He has noticed that his urine has darker color and looks more cloudy. Denies straining, decreased stream, and pain with urination. Patient endorses subjective fevers and chills around the same time when these urinary symptoms began. Also notes decreased appetite and increased pain in his lower back/flank that have been progressively worsening leading up to admission. Patient sexually active with one male partner with whom he is monogamous. Denies pain with ejaculation and perineal pain, but notes that ejaculation was more difficult than usual after his urinary symptoms began. Patient denies chest pain, shortness of breath, abdominal pain, lower extremity swelling, and headaches/focal weakness.  Per chart review patient initially treated 8 days ago for UTI with ciprofloxacin as an outpatient by his primary care physician. The  day prior to admission the patient returned to physician complaining of ongoing urinary symptoms with flank pain. At this time he was given a shot of antibiotics "in his butt" and prescribed bactrim. Patient came to ED for evaluation with concern for new onset fever, chills in the setting of this ongoing symptoms.   Upon arrival to the ED patient was afebrile with T max of 98.6, tachycardic to 103, hypotensive with BP 90s/50s and saturating 100% on room air. CBC resulted with WBC =12.8. Lactic acid = 1.49. CMP significant for Cr of 3.29, with previous baseline of 1.4-1.6, BUN = 36, K = 3.2, Na = 134, and T Bili 1.5. Urinalysis showed moderate Hgb, protein = 30, LE = large, nitrate = negative with >50 WBC, 6-10 RBC, and rare bacteria per HPF. CT abdomen pelvis did not show evidence of renal stones but did show perinephric stranding suggestive of acute/recent infection. Patient met sepsis criteria on admission and was given 2L NS, urine culture was obtained, IV zosyn was started. IMTS was called for admission and further management.   Meds:  No outpatient medications have been marked as taking for the 11/17/17 encounter [facility] Encounter).   Allergies: Allergies as of 11/17/2017 - Review Complete 11/17/2017  Allergen Reaction Noted  . Contrast media [iodinated diagnostic agents] Other (See Comments) 08/27/2011  . Fosinopril Swelling 10/30/2011  . Ioxaglate Other (See Comments) 08/27/2011  . Lisinopril Other (See Comments) 08/27/2011  . Adhesive [tape] Rash 11/17/2017   Past Medical  History: Past Medical History:  Diagnosis Date  . Arthritis    DDD  . Diabetes mellitus without complication ([place])   . GERD (gastroesophageal reflux disease)   . Hypertension   . Narrowing of intervertebral disc space   . Sleep apnea    CPAP   Family History:  Family History  Problem Relation Age of Onset  . Hypertension Mother   . Ulcers Mother   . Hypertension Brother   . Cancer Brother   . Cancer  Sister   . Stroke Paternal Grandmother    Social History: Lives on his own and retired from the [employer]. Non smoker (smoked 1-2 cigarettes at age 63). Social drinker (less than one drink a month). No illicit drugs or narcotic medications not prescribed to him. Patient currently sexually active with one male partner, who does not have any symptoms herself.   Review of Systems: A complete ROS was negative except as per HPI.   Physical Exam: Blood pressure 125/73, pulse (!) 101, temperature (!) 102.8 F (39.3 C), temperature source Rectal, resp. rate 16, height _0  (1.702 m), weight 188 lb (85.3 kg), SpO2 96 %.  Physical Exam  Constitutional: He appears well-developed and well-nourished. No distress.  HENT:  Dry cracked lips, dry oropharynx. No exudate.  Eyes: Conjunctivae and EOM are normal. No scleral icterus.  Cardiovascular: Normal rate, regular rhythm and intact distal pulses. Exam reveals no gallop and no friction rub.  Murmur (2/6 holosystolic murmur at left sternal border) heard. Tachycardic.  Respiratory: Effort normal. No respiratory distress. He has no wheezes. He has no rales.  GI: Soft. Bowel sounds are normal. He exhibits no distension. There is no tenderness. There is no rebound.  Musculoskeletal: He exhibits no edema (of bilateral lower extremities) or tenderness (of bilateral lower extremities).  CVA tenderness bilaterally (L>R)  Neurological: He is alert.  Face strength and sensation intact bilaterally. Tongue midline. Gross motor and sensation to light touch of upper and lower extremities intact bilaterally.  Skin: Skin is warm. No rash noted. He is diaphoretic. No erythema.  Psychiatric: He has a normal mood and affect. His behavior is normal.   Assessment & Plan by Problem: Active Problems:   Pyelonephritis  [name] is 67 yo with PMH of diet controlled DM, GERD, HTN, CKD stage II, and recent UTI treated with ciprofloxacin who presented to the ED for evaluation  of chills, flank pain, and worsening dysuria. His initial workup was consistent with pyelonephritis. He was admitted to the internal medicine teaching service for management. The specific problems addressed during admission are as follows:  Pyelonephritis: Patient with clinical signs/symptoms of pyelonephritis and changes on imaging consistent with this diagnosis. Urinalysis suggestive of acute infection and urine culture pending. Patient initially treated with ciprofloxacin for symptoms 8 days ago but had no clinical response (and progressed on this treatment). Given this history patient considered at high risk for ESBL, so will broaden to IV meropenem to cover for this possibility until culture data is obtained. Patient states he was referred to urology for further workup as an outpatient by his PCP and thinks has appointment scheduled with them in the AM.  -Admit to med-surg -IV meropenem -NS @ rate of 100 ml/hr -Repeat BMP & CBC in AM -F/u urine & blood cultures obtained in ED -IV zofran for nausea, will give Tylenol PRN for pain/fever (escalate to oxycodone only if needed)  Acute on chronic kidney injury: Patient's Cr elevated to 3.29 (eGFR = 20) on admission. Historical  baseline shows Cr of 1.4 (eGFR around 60). The etiology of this AKI is likely multifactorial. First, patient appears clinically dehydrated (tachycardia, hypotension, dry MM), suggesting prerenal component. Patient s/p 2L NS in ED and will remain on IVF overnight to assist with volume resuscitation. The acute infection described above also likely contributing to AKI. No signs of obstruction on CT renal study, suggest post renal etiology not contributing. Will closely monitor Cr and urine output while on IVF and antibiotics for treatment of pyelonephritis.  -NS @ rate 100 ml/hr -Daily BMP  HTN: Patient hypotensive in the ED. Per chart review patient takes chlorthalidone 25 mg, losartan 50 mg, and amlodipine 10 mg daily for  hypertension as an outpatient. Patient states BP typically <120/80 on this regimen. Given hypotension on arrival, will hold antihypertensives and add back as needed -Continue to monitor  T2DM: Patient diet controlled with reported last A1C = 6.3% (unknown time). BG on admission 170, unclear if fasting.  -CBG monitoring per unit routine -CM diet -Gabapentin 300 mg qHS -Hemoglobin A1C  FEN/GI: -Heart Healthy, CM diet  -NS @ rate 100 ml/hr, 40 mEq K+  VTE Prophylaxis: Lovenox daily Code Status: Full  Dispo: Admit patient to Observation with expected length of stay less than 2 midnights.  SignedThomasene Ripple, MD 11/17/2017, 6:49 PM  Pager: [phone]

## 2017-11-18 ENCOUNTER — Other Ambulatory Visit: Payer: Self-pay

## 2017-11-18 ENCOUNTER — Encounter (HOSPITAL_COMMUNITY): Payer: Self-pay | Admitting: Orthopedic Surgery

## 2017-11-18 DIAGNOSIS — K219 Gastro-esophageal reflux disease without esophagitis: Secondary | ICD-10-CM

## 2017-11-18 DIAGNOSIS — I129 Hypertensive chronic kidney disease with stage 1 through stage 4 chronic kidney disease, or unspecified chronic kidney disease: Secondary | ICD-10-CM

## 2017-11-18 DIAGNOSIS — E1122 Type 2 diabetes mellitus with diabetic chronic kidney disease: Secondary | ICD-10-CM

## 2017-11-18 DIAGNOSIS — N12 Tubulo-interstitial nephritis, not specified as acute or chronic: Principal | ICD-10-CM

## 2017-11-18 DIAGNOSIS — Z87440 Personal history of urinary (tract) infections: Secondary | ICD-10-CM

## 2017-11-18 DIAGNOSIS — Z79899 Other long term (current) drug therapy: Secondary | ICD-10-CM

## 2017-11-18 DIAGNOSIS — N182 Chronic kidney disease, stage 2 (mild): Secondary | ICD-10-CM

## 2017-11-18 DIAGNOSIS — N179 Acute kidney failure, unspecified: Secondary | ICD-10-CM

## 2017-11-18 LAB — COMPREHENSIVE METABOLIC PANEL
ALT: 42 U/L (ref 17–63)
ANION GAP: 10 (ref 5–15)
AST: 42 U/L — ABNORMAL HIGH (ref 15–41)
Albumin: 2.4 g/dL — ABNORMAL LOW (ref 3.5–5.0)
Alkaline Phosphatase: 90 U/L (ref 38–126)
BUN: 34 mg/dL — ABNORMAL HIGH (ref 6–20)
CALCIUM: 7.4 mg/dL — AB (ref 8.9–10.3)
CO2: 20 mmol/L — AB (ref 22–32)
Chloride: 108 mmol/L (ref 101–111)
Creatinine, Ser: 3.18 mg/dL — ABNORMAL HIGH (ref 0.61–1.24)
GFR, EST AFRICAN AMERICAN: 22 mL/min — AB (ref >60)
GFR, EST NON AFRICAN AMERICAN: 19 mL/min — AB (ref >60)
Glucose, Bld: 149 mg/dL — ABNORMAL HIGH (ref 65–99)
Potassium: 3.2 mmol/L — ABNORMAL LOW (ref 3.5–5.1)
Sodium: 138 mmol/L (ref 135–145)
TOTAL PROTEIN: 6.1 g/dL — AB (ref 6.5–8.1)
Total Bilirubin: 1.3 mg/dL — ABNORMAL HIGH (ref 0.3–1.2)

## 2017-11-18 LAB — CBC
HCT: 34.5 % — ABNORMAL LOW (ref 39.0–52.0)
Hemoglobin: 11.8 g/dL — ABNORMAL LOW (ref 13.0–17.0)
MCH: 28.6 pg (ref 26.0–34.0)
MCHC: 34.2 g/dL (ref 30.0–36.0)
MCV: 83.7 fL (ref 78.0–100.0)
Platelets: 173 10*3/uL (ref 150–400)
RBC: 4.12 MIL/uL — AB (ref 4.22–5.81)
RDW: 13.8 % (ref 11.5–15.5)
WBC: 11.7 10*3/uL — ABNORMAL HIGH (ref 4.0–10.5)

## 2017-11-18 LAB — GLUCOSE, CAPILLARY
Glucose-Capillary: 127 mg/dL — ABNORMAL HIGH (ref 65–99)
Glucose-Capillary: 185 mg/dL — ABNORMAL HIGH (ref 65–99)
Glucose-Capillary: 118 mg/dL — ABNORMAL HIGH (ref 65–99)

## 2017-11-18 LAB — PROTIME-INR
INR: 1.49
PROTHROMBIN TIME: 17.9 s — AB (ref 11.4–15.2)

## 2017-11-18 LAB — MAGNESIUM: MAGNESIUM: 1.8 mg/dL (ref 1.7–2.4)

## 2017-11-18 MED ORDER — SODIUM CHLORIDE 0.9 % IV BOLUS
1000.0000 mL | Freq: Once | INTRAVENOUS | Status: AC
  Administered 2017-11-18: 1000 mL via INTRAVENOUS

## 2017-11-18 MED ORDER — POTASSIUM CHLORIDE CRYS ER 20 MEQ PO TBCR
40.0000 meq | EXTENDED_RELEASE_TABLET | Freq: Two times a day (BID) | ORAL | Status: AC
  Administered 2017-11-18 (×2): 40 meq via ORAL
  Filled 2017-11-18 (×2): qty 2

## 2017-11-18 NOTE — Progress Notes (Signed)
   Subjective:  Patient seen laying comfortably in bed in no acute distress. Patient states that he continues to have bilateral flank pain and increased urinary urgency. Also notes feeling feverish overnight. No other acute complaints.  Objective:  Vital signs in last 24 hours: Vitals:   11/18/17 0300 11/18/17 0400 11/18/17 0500 11/18/17 0534  BP: 110/69 114/73 108/73 116/72  Pulse: 92 89 87 82  Resp: 18  18   Temp:    98.4 F (36.9 C)  TempSrc:    Oral  SpO2: 98% 98% 97% 96%  Weight:    185 lb (83.9 kg)  Height:    '5\' 7"'$  (1.702 m)   Physical Exam  Constitutional: He appears well-developed and well-nourished. No distress.  HENT:  Interval improvement in dry cracked lips  Eyes: Conjunctivae and EOM are normal. No scleral icterus.  Cardiovascular: Normal rate, regular rhythm and intact distal pulses.  No murmur heard. Respiratory: Effort normal. No respiratory distress. He has no wheezes. He has no rales.  GI: Soft. Bowel sounds are normal. He exhibits no distension. There is no tenderness. There is no rebound.  Musculoskeletal: He exhibits no edema (of bilateral lower extremities) or tenderness (of bilateral lower extremities).  Skin: Skin is warm. No rash noted. He is diaphoretic. No erythema.   Assessment/Plan:  Active Problems:   Pyelonephritis  Micheal Wood is 67 yo with PMH of diet controlled DM, GERD, HTN, CKD stage II, and recent UTI treated with ciprofloxacin who presented to the ED for evaluation of chills, flank pain, and worsening dysuria. His initial workup was consistent with pyelonephritis. He was admitted to the internal medicine teaching service for management. The specific problems addressed during admission are as follows:  Pyelonephritis: Patient with fever overnight and ongoing symptoms. Leukocystosis worsened this AM. Patient received first dose of meropenem late last night, additional one running this AM on rounds. Will need to continue close monitoring for  broad spectrum regimen until urine culture results. If patient does not improve will need to consider renal ultrasound for evaluation of abscess (although none noted on admission CT) given ongoing symptoms in spite of IV antibiotic therapy. -IV meropenem 1g BID -NS @ rate of 200 ml/hr -F/u urine & blood cultures obtained in ED -IV zofran for nausea, will give Tylenol PRN for pain/fever   Acute on chronic kidney injury: Patient's Cr elevated to 3.29 (eGFR = 20) on admission, with repeat of 3.18 today. Will increase rate of IVF today given that patient endorses decreased PO intake and only slight improvement after 2L NS in ED.  -NS @ rate 250 ml/hr -Daily BMP  HTN: Patient currently normotensive off home regimen of chlorthalidone 25 mg, losartan 50 mg, and amlodipine 10 mg daily -Continue to monitor, add back home medications PRN  T2DM: Patient diet controlled with A1C on admission <6.5%, indicating good control at baseline.  -CBG monitoring per unit routine -CM diet -Gabapentin 300 mg qHS  FEN/GI: -Heart Healthy, CM diet  -NS @ rate 250 ml/hr, 80 mEq K+ today  VTE Prophylaxis: Lovenox daily Code Status: Full  Dispo: Anticipated discharge in approximately 2-3 day(s).   Thomasene Ripple, MD 11/18/2017, 10:27 AM Pager: [phone]

## 2017-11-18 NOTE — Progress Notes (Signed)
Placed pt on CPAP [id number] tolerating well.

## 2017-11-18 NOTE — Progress Notes (Signed)
  Date: 11/18/2017  Patient name: Story Vanvranken  Medical record number: [id number]  Date of birth: [birth year]   I have seen and evaluated Brigitte Pulse and discussed their care with the Residency Team. Mr Quam is a 67 yo man with CKD stage 2 who presents with 8 days of increasing fevers, flank pain, urgency, chills, and freq. He failed outpt Cipro tx which was started 8 days prior to admission. His sxs, exam, labs, and CT were c/w B pyelo.  Vitals:   11/18/17 0534 11/18/17 1235  BP: 116/72 108/66  Pulse: 82 83  Resp:  14  Temp: 98.4 F (36.9 C) 98.9 F (37.2 C)  SpO2: 96% 100%  RR 14 T max 102.8 Gen : diaphoretic otherwise, NAD HRRR No MRG LCTAB with good air flow  K 3.2 Bicarb 24-20 Gap 11 - 10 Cr 3.29 - 3.18 (bseline about 1.5) LA 1.49 WBC 10.5 - 11.7 HgB 11.8 UA 0-5 sq epi, > 50 WBC, 6-10 RBC, neg nitrite, lg leukocytes  CT : B changes c/w pyelo, no enlarged bladder  Assessment and Plan: I have seen and evaluated the patient as outlined above. I agree with the formulated Assessment and Plan as detailed in the residents' note, with the following changes:  Mr Sallade is a 67 yo man with CKD stage 2 who presents with 8 days sxs c/w pyelo after failing outpt treatment with Cipro. His sxs, exam, labs, and CT were c/w B pyelo. He is currently not septic. Due to concern for resistance, he was started on meropenem. He also had an AKI.  1. B pyelo - pt will be cont on meropenum until we can narrow ABX based on cx results. Expect pt to get rapid resolution of his sxs in the next 24-48 hrs. Anticipate we will be able to narrow ABX and D/c pt in the next 24-48 hrs.   2. AKI - pt had no obstruction on CT. Pt is not on any nephrotoxic agents (not taking his ARB) and did not receive contrast. Suspect etiology is pre-renal and he is receiving IVF with a slight decrease in his Cr.   Burns Spain, MD 5/13/201912:40 PM

## 2017-11-18 NOTE — Progress Notes (Signed)
Patient states he will have RN contact RT when ready for BIPAP.

## 2017-11-19 DIAGNOSIS — R0982 Postnasal drip: Secondary | ICD-10-CM

## 2017-11-19 DIAGNOSIS — Z1612 Extended spectrum beta lactamase (ESBL) resistance: Secondary | ICD-10-CM

## 2017-11-19 DIAGNOSIS — Z91048 Other nonmedicinal substance allergy status: Secondary | ICD-10-CM

## 2017-11-19 DIAGNOSIS — B9620 Unspecified Escherichia coli [E. coli] as the cause of diseases classified elsewhere: Secondary | ICD-10-CM

## 2017-11-19 DIAGNOSIS — Z888 Allergy status to other drugs, medicaments and biological substances status: Secondary | ICD-10-CM

## 2017-11-19 DIAGNOSIS — Z91041 Radiographic dye allergy status: Secondary | ICD-10-CM

## 2017-11-19 LAB — CBC
HCT: 32.8 % — ABNORMAL LOW (ref 39.0–52.0)
Hemoglobin: 11.1 g/dL — ABNORMAL LOW (ref 13.0–17.0)
MCH: 29 pg (ref 26.0–34.0)
MCHC: 33.8 g/dL (ref 30.0–36.0)
MCV: 85.6 fL (ref 78.0–100.0)
Platelets: 173 10*3/uL (ref 150–400)
RBC: 3.83 MIL/uL — ABNORMAL LOW (ref 4.22–5.81)
RDW: 14.8 % (ref 11.5–15.5)
WBC: 9.3 10*3/uL (ref 4.0–10.5)

## 2017-11-19 LAB — URINE CULTURE: Culture: 10000 — AB

## 2017-11-19 LAB — COMPREHENSIVE METABOLIC PANEL
ALT: 35 U/L (ref 17–63)
ANION GAP: 8 (ref 5–15)
AST: 30 U/L (ref 15–41)
Albumin: 2.2 g/dL — ABNORMAL LOW (ref 3.5–5.0)
Alkaline Phosphatase: 92 U/L (ref 38–126)
BILIRUBIN TOTAL: 0.7 mg/dL (ref 0.3–1.2)
BUN: 28 mg/dL — ABNORMAL HIGH (ref 6–20)
CALCIUM: 7.6 mg/dL — AB (ref 8.9–10.3)
CO2: 21 mmol/L — ABNORMAL LOW (ref 22–32)
Chloride: 112 mmol/L — ABNORMAL HIGH (ref 101–111)
Creatinine, Ser: 2.59 mg/dL — ABNORMAL HIGH (ref 0.61–1.24)
GFR calc Af Amer: 28 mL/min — ABNORMAL LOW (ref >60)
GFR calc non Af Amer: 24 mL/min — ABNORMAL LOW (ref >60)
Glucose, Bld: 109 mg/dL — ABNORMAL HIGH (ref 65–99)
Potassium: 3.5 mmol/L (ref 3.5–5.1)
Sodium: 141 mmol/L (ref 135–145)
TOTAL PROTEIN: 5.6 g/dL — AB (ref 6.5–8.1)

## 2017-11-19 LAB — GLUCOSE, CAPILLARY: GLUCOSE-CAPILLARY: 110 mg/dL — AB (ref 65–99)

## 2017-11-19 MED ORDER — SULFAMETHOXAZOLE-TRIMETHOPRIM 800-160 MG PO TABS: 1.0000 | ORAL_TABLET | Freq: Two times a day (BID) | ORAL | 0 refills | Status: AC

## 2017-11-19 MED ORDER — AMLODIPINE BESYLATE 10 MG PO TABS
10.0000 mg | ORAL_TABLET | Freq: Every day | ORAL | Status: DC
  Administered 2017-11-19: 10 mg via ORAL
  Filled 2017-11-19: qty 1

## 2017-11-19 MED ORDER — SULFAMETHOXAZOLE-TRIMETHOPRIM 800-160 MG PO TABS: 1.0000 | ORAL_TABLET | Freq: Two times a day (BID) | ORAL | 0 refills | Status: DC

## 2017-11-19 NOTE — Progress Notes (Signed)
   Subjective:  Patient seen sitting comfortably in bed this AM, eating breakfast in no acute distress. Patient states that he feels overall improved from admission, noting increased urine output without dysuria and improvement in bilateral flank pain. Patient endorsed post nasal drip and intermittent productive cought with clear-yellow sputum, which he intermittently experiences at home when he is unable to use his ear drops. No other acute complaints.   Objective:  Vital signs in last 24 hours: Vitals:   11/18/17 1235 11/18/17 2146 11/18/17 2330 11/19/17 0500  BP: 108/66 117/81  (!) 138/93  Pulse: 83 75 82 81  Resp: Temp: 98.9 F (37.2 C) 98.5 F (36.9 C)  98.9 F (37.2 C)  TempSrc: Oral Oral  Oral  SpO2: 100% 100% 100% 100%  Weight:      Height:       Physical Exam  Constitutional: He appears well-developed and well-nourished. No distress.  HENT:  Mouth/Throat: Oropharynx is clear and moist. No oropharyngeal exudate.  Eyes: Conjunctivae and EOM are normal.  Cardiovascular: Normal rate, regular rhythm and intact distal pulses.  No murmur heard. Respiratory: Effort normal. No respiratory distress. He has no wheezes. He has no rales.  GI: Soft. Bowel sounds are normal. He exhibits no distension. There is no tenderness.  Musculoskeletal: He exhibits no edema (of bilateral lower extremities) or tenderness (of bilateral lower extremities).  Improved CVA tenderness  Skin: Skin is warm. No rash noted. He is not diaphoretic. No erythema.   Assessment/Plan:  Active Problems:   Pyelonephritis  Micheal Wood is 67 yo with PMH of diet controlled DM, GERD, HTN, CKD stage II, and recent UTI treated with ciprofloxacin who presented to the ED for evaluation of chills, flank pain, and worsening dysuria. His initial workup was consistent with pyelonephritis. He was admitted to the internal medicine teaching service for management. The specific problems addressed during admission are  as follows:  Pyelonephritis: Patient afebrile for the past 24 hours. Urine culture with 10,000 E.Coli colonies, sensitivities show ESBL sensitive to bactrim. Since patient tolerating PO, will transition to bactrim today to take twice a day for 5 more days.  -Transition to bactrim upon discharge -Blood cultures no growth @ 24 hours  Acute on chronic kidney injury: Patient's Cr improved to 2.59 from 3.29 on admission. Will continue to encourage PO intake, as patient has developed non-anion gap metabolic acidosis from aggressive fluid administration yesterday. Patient stable for discharge with close follow up with PCP to check Cr for return to baseline after treating pyelonephritis, volume resuscitation.  -Daily BMP  HTN: Patient currently high limit of normal for blood pressure. He has had soft blood pressures this admission, so currently off home regimen of chlorthalidone 25 mg, losartan 50 mg, and amlodipine 10 mg daily. Will given amlodipine today and have patient hold losartan but continue chlorthalidone on discharge. Patient can resume losartan as outpatient after Cr normalizes.  -Amlodipine 10 mg -Continue to monitor  T2DM: Patient diet controlled with A1C on admission <6.5%, indicating good control at baseline. Recent CBG's appropriate. -CBG monitoring per unit routine -CM diet -Gabapentin 300 mg qHS  FEN/GI: -Heart Healthy, CM diet  -No IVF, electrolytes wnl  VTE Prophylaxis: Lovenox daily Code Status: Full  Dispo: Anticipated discharge today.  Micheal Lesches, MD 11/19/2017, 7:24 AM Pager: [phone]

## 2017-11-19 NOTE — Discharge Summary (Signed)
Name: Micheal Wood MRN: [id number] DOB: 04/22/1951 67 y.o. PCP: [facility] [place]  Date of Admission: 11/17/2017  2:28 PM Date of Discharge: 11/19/2017 Attending Physician: Larey Dresser, MD  Discharge Diagnosis: Pyelonephritis   Other active medical conditions: AKI, Hypertension, Diet-controlled diabetes mellitus  Discharge Medications: Allergies as of 11/19/2017      Reactions   Contrast Media [iodinated Diagnostic Agents] Other (See Comments)   Caused a "burning sensation" ALL OVER   Fosinopril Swelling   Facial edema   Ioxaglate Other (See Comments)   Contrast Dye caused a "burning sensation" ALL OVER   Lisinopril Other (See Comments)   Patient stated that it made him feel like he was losing his mind   Adhesive [tape] Rash      Medication List    STOP taking these medications   losartan 50 MG tablet Commonly known as:  COZAAR     TAKE these medications   amLODipine 10 MG tablet Commonly known as:  NORVASC Take 5 mg by mouth daily.   aspirin EC 81 MG tablet Take 81 mg by mouth daily.   baclofen 10 MG tablet Commonly known as:  LIORESAL Take 1 tablet (10 mg total) by mouth 3 (three) times daily. What changed:    when to take this  reasons to take this   Calcium Carbonate-Vitamin D 600-400 MG-UNIT tablet Take 2 tablets by mouth every other day.   carboxymethylcellulose 1 % ophthalmic solution Place 1 drop into both eyes 3 (three) times daily.   THERATEARS 1 % Gel Generic drug:  Carboxymethylcellulose Sod PF Place 1 drop into both eyes daily as needed (for dryness).   chlorthalidone 50 MG tablet Commonly known as:  HYGROTON Take 50 mg by mouth daily.   COQ10 PO Take 1 capsule by mouth daily.   EMERGEN-C VITAMIN C Pack Take 1 packet by mouth daily.   fluticasone 50 MCG/ACT nasal spray Commonly known as:  FLONASE Place 1 spray into both nostrils at bedtime.   FUNGI-NAIL EX Apply 1 application topically See admin instructions. One  application to affected nails daily as needed for fungus   gabapentin 300 MG capsule Commonly known as:  NEURONTIN Take 1 capsule (300 mg total) by mouth at bedtime. What changed:    when to take this  reasons to take this   ICY HOT EX Apply 1 application topically daily as needed (pain).   ketoconazole 2 % shampoo Commonly known as:  NIZORAL Apply 1 application topically daily as needed (tinea versicolor on back).   Lysine 1000 MG Tabs Take 1,000 mg by mouth 3 (three) times a week.   omeprazole 20 MG capsule Commonly known as:  PRILOSEC Take 20 mg by mouth 2 (two) times daily before a meal.   ondansetron 4 MG disintegrating tablet Commonly known as:  ZOFRAN ODT Take 1 tablet (4 mg total) by mouth every 8 (eight) hours as needed for nausea or vomiting.   potassium chloride SA 20 MEQ tablet Commonly known as:  K-DUR,KLOR-CON Take 20 mEq by mouth daily.   sildenafil 100 MG tablet Commonly known as:  VIAGRA Take 100 mg by mouth daily as needed for erectile dysfunction.   sulfamethoxazole-trimethoprim 800-160 MG tablet Commonly known as:  BACTRIM DS,SEPTRA DS Take 1 tablet by mouth 2 (two) times daily for 5 days. What changed:  additional instructions   SWIM EAR OT Place 4-5 drops into both ears daily as needed (for clogged ears).   vitamin E 400 UNIT capsule Take  400 Units by mouth 3 (three) times a week.   Vitamin-B Complex Tabs Take 1 tablet by mouth daily.   zinc gluconate 50 MG tablet Take 50 mg by mouth every other day.      Disposition and follow-up:   [name] was discharged from [facility] in Stable condition.  At the hospital follow up visit please address:  1.  Patient admitted with pyelonephritis after failing to improve on ciprofloxacin as an outpatient for treatment of UTI. Patient found to have ESBL E.Coli in urine that was treated with IV antibiotics and Bactrim x5 days as outpatient. Please follow up assess for  signs/symptoms of urinary tract infection.  Patient had AKI with Cr increased to 3.5 from baseline of 1.4-1.6. AKI was 2/2 dehydration and ongoing pyelonephritis. His Cr improved to 2.6 with treatment and holding of home losartan. Please assess for resolution of AKI with BMP.   2.  Labs / imaging needed at time of follow-up: BMP  3.  Pending labs/ test needing follow-up: Blood cultures (no growth at 48 hours)  Follow-up Appointments: [place], [facility]. Call.   Why:  Please make an appointment with your PCP to discuss your hospitalization, obtain blood work (basic metabolic panel to check kidney function), and discuss resuming losartan (or olmesartan) for BP control. Bring this paperwork to that visit. Contact information: [place] [id number] [place] Hospital Course by problem list: Active Problems:   Pyelonephritis   Micheal Wood is 67 yo with PMH ofdiet controlledDM, GERD, HTN, CKD stage II, and recent UTI treated with ciprofloxacin who presented to the ED for evaluation of chills, flank pain, and worsening urinary urgency for about 1 week leading up to admission. His initial workup was consistent with pyelonephritis. He was admitted to the internal medicine teaching service for management. The specific problems addressed during admission are as follows:  Pyelonephritis: Patient presented with fevers, urinary urgency, and flank pain along with increased urinary frequency which was concerning for UTI and possible pyelonephritis. Symptoms had been progressively worsening in the week leading up to admission in spite of ongoing treatment for UTI with ciprofloxacin diagnosed by PCP at the onset of symptoms. Upon arrival patient was febrile with T max of 102.75F (rectal) and found to have leukocytosis, hypotension, and evidence of UTI on urinalysis (+WBC, LE, rare bacteria, mod hgb). Patient had a CT renal study on admission  which was negative for nephrolithiasis and hydronephrosis, but did show bilateral perinephric stranding consistent with pyelonephritis. The patient's clinical exam was consistent with this diagnosis, as he demonstrated bilateral CVA tenderness (L>R) on admission. Given recent failed treatment with ciprofloxacin as outpatient, patient was considered high risk for ESBL and started on meropenem in the ED. Prior to initiating antibiotics the patient's urine was sent for culture and blood cultures were obtained. Patient clinically improved with 48 hours of IV meropenem. After initiation of antibiotics, he remained afebrile. On HD #3 patient's urine culture resulted ESBL E.Coli that was sensitive to Bactrim. Patient's blood cultures returned no growth at 48 hours. Patient was discharged with instructions to take Bactrim 1 Tablet BID for 5 days to complete a total of 7 days of therapy and to follow up with PCP for clinical resolution of symptoms.   Acute on chronic kidney injury: Patient's Cr elevated to 3.29 (eGFR = 20) on admission, with historical baseline per chart review around 1.4-1.6. The  etiology of the AKI was thought to be multifactorial, first a result of pyelonephritis and second a result of dehydration as the patient reported decreased PO intake as a result of feeling unwell in the week leading up to admission. Further supporting prerenal cause of the patient's AKI was the fact that the patient appeared clinically dehydrated. Patient was started on aggressive IVF hydration, nephrotoxic medications (losartan) were held on admission, and the patient's infection was treated with IV antibiotics to improve the patient's kidney function. With all these interventions the patient's Cr improved to 2.59 on day of discharge. Please reassess with BMP at follow up to ensure complete resolution after finishing course of antibiotics.  HTN: Patient hypotensive on admission. Per chart and patient review patient was on  chlorthalidone, losartan, and amlodipine 10 mg as an outpatient for BP management. These medications were held 2/2 hypotension and AKI on admission. Patient instructed to continuing holding losartan on discharge given ongoing AKI and to resume other home medications as usual the day after discharge. Please restart ARB if appropriate at follow up. Patient expressed concerns multiple times during hospitalization about taking losartan as an outpatient given recent recall. Please consider switching to olmesartan to promote medication compliance. If patient amenable to switch, combination pill of olmesartan-hydrochlorothiazide-amlodipine could be initiated to decrease pill burden and improve compliance if patient amenable.   Discharge Vitals:   BP (!) 138/93 (BP Location: Left Arm)   Pulse 81   Temp 98.9 F (37.2 C) (Oral)   Resp 16   Ht 5' 7"  (1.702 m)   Wt 185 lb (83.9 kg)   SpO2 100%   BMI 28.98 kg/m   Pertinent Labs, Studies, and Procedures:  BMP Latest Ref Rng & Units 11/19/2017 11/18/2017 11/17/2017  Glucose 65 - 99 mg/dL 109(H) 149(H) -  BUN 6 - 20 mg/dL 28(H) 34(H) -  Creatinine 0.61 - 1.24 mg/dL 2.59(H) 3.18(H) 3.06(H)  Sodium 135 - 145 mmol/L 141 138 -  Potassium 3.5 - 5.1 mmol/L 3.5 3.2(L) -  Chloride 101 - 111 mmol/L 112(H) 108 -  CO2 22 - 32 mmol/L 21(L) 20(L) -  Calcium 8.9 - 10.3 mg/dL 7.6(L) 7.4(L) -   CBC Latest Ref Rng & Units 11/19/2017 11/18/2017 11/17/2017  WBC 4.0 - 10.5 K/uL 9.3 11.7(H) 10.5  Hemoglobin 13.0 - 17.0 g/dL 11.1(L) 11.8(L) 12.0(L)  Hematocrit 39.0 - 52.0 % 32.8(L) 34.5(L) 34.4(L)  Platelets 150 - 400 K/uL 173 173 162   Urinalysis    Component Value Date/Time   COLORURINE YELLOW 11/17/2017 1615   APPEARANCEUR CLOUDY (A) 11/17/2017 1615   LABSPEC 1.011 11/17/2017 1615   PHURINE 5.0 11/17/2017 1615   GLUCOSEU NEGATIVE 11/17/2017 1615   HGBUR MODERATE (A) 11/17/2017 1615   BILIRUBINUR NEGATIVE 11/17/2017 1615   KETONESUR NEGATIVE 11/17/2017 1615    PROTEINUR 30 (A) 11/17/2017 1615   NITRITE NEGATIVE 11/17/2017 1615   LEUKOCYTESUR LARGE (A) 11/17/2017 1615   Urine Culture with sensitivities, 11/17/2017 Urine culture [[id number]] (Abnormal)  Collected: 11/17/17 1620  Order Status: Completed Specimen: Urine, Clean Catch Updated: 11/19/17 0751   Specimen Description URINE, CLEAN CATCH   Special Requests NONE   Culture --Abnormal    10,000 COLONIES/mL ESCHERICHIA COLI  Confirmed Extended Spectrum Beta-Lactamase Producer (ESBL). In bloodstream infections from ESBL organisms, carbapenems are preferred over piperacillin/tazobactam. They are shown to have a lower risk of mortality.  Performed at [facility], [place] [address], [place], [place] [address]  Abnormal    Report Status 11/19/2017 FINAL  Organism ID, Bacteria ESCHERICHIA COLIAbnormal   Susceptibility  Antibiotic Interpretation Microscan Method Status   AMPICILLIN Resistant >=32 RESISTANT MIC Final   CEFAZOLIN Resistant >=64 RESISTANT MIC Final   CEFTRIAXONE Resistant >=64 RESISTANT MIC Final   CIPROFLOXACIN Resistant >=4 RESISTANT MIC Final   GENTAMICIN Resistant >=16 RESISTANT MIC Final   IMIPENEM Sensitive <=0.25 SENSITIVE MIC Final   NITROFURANTOIN Sensitive <=16 SENSITIVE MIC Final   TRIMETH/SULFA Sensitive <=20 SENSITIVE MIC Final   AMPICILLIN/SULBACTAM Resistant >=32 RESISTANT MIC Final   PIP/TAZO Sensitive 16 SENSITIVE MIC Final   Extended ESBL Resistant POSITIVE MIC Final   Susceptibility Comments   10,000 COLONIES/mL ESCHERICHIA COLI       Blood culture x2, 11/17/2017 Culture, blood (Routine x 2) [[id number]] Collected: 11/17/17 1457  Specimen: Blood Updated: 11/19/17 1012   Specimen Description BLOOD LEFT ANTECUBITAL   Special Requests BOTTLES DRAWN AEROBIC AND ANAEROBIC Blood Culture adequate volume   Culture --   NO GROWTH 2 DAYS  Performed at [facility], [address], [place], [place] [address]    Report Status PENDING    Discharge  Instructions: Discharge Instructions    Call MD for:  persistant dizziness or light-headedness   Complete by:  As directed    Call MD for:  persistant nausea and vomiting   Complete by:  As directed    Call MD for:  severe uncontrolled pain   Complete by:  As directed    Call MD for:  temperature >100.4   Complete by:  As directed    Diet - low sodium heart healthy   Complete by:  As directed    Discharge instructions   Complete by:  As directed    Mr. Micheal Wood,  You were evaluated for fevers, chills, and increased urinary urgency. You were found to have an infection in your kidneys and your lower urinary tract. The bacteria causing this infection was not sensitive to the antibiotic ciprofloxacin, which is why you didn't get better after taking that medicine as an outpatient. You were given broad spectrum IV antibiotics for control of your kidney infection until our laboratory studies identify the bacteria (E. Coli) causing your infection. Our tests confirm that your bacteria is sensitive to Bactrim. You will need to take Bactrim twice a day for the next 5 days (now - Saturday, 11/23/2017). Your symptoms should continue to gradually improve after leaving the hospital. Please continue to stay hydrated with plenty of fluids.  As a result of this infection, your kidney function was decreased from baseline. Because of this we want you to stop taking losartan, a blood pressure medication which affects your kidneys, until you follow up with a primary care physician. Please follow up with your PCP by Friday 11/22/2017. At your PCP's office, they should go over the notes from this hospitalization, check your kidney infection with blood work, and assess your symptoms after taking these antibiotics. At that time they will likely resume losartan (or similar medication such as olmesartan) for management of your blood pressure.   Increase activity slowly   Complete by:  As directed       Signed: Thomasene Ripple, MD 11/19/2017, 2:17 PM   Pager: [phone]

## 2017-11-19 NOTE — Progress Notes (Signed)
  Date: 11/19/2017  Patient name: Micheal Wood  Medical record number: [id number]  Date of birth: 01-20-1951   I have seen and evaluated this patient and I have discussed the plan of care with the house staff. Please see their note for complete details. I concur with their findings with the following additions/corrections: [name] was seen on AM rounds with the team. He has been afebrile for over 24 hrs, AKI resolving, feeles great, and can be transitioned to bactrim for his ESBL E Coli pyelo. Stable for D/C home.  Burns Spain, MD 11/19/2017, 11:43 AM

## 2017-11-19 NOTE — Progress Notes (Signed)
Pt given discharge instructions and verbalized understanding of medication administration, follow-up appt and when to return in case of emergencies.

## 2017-11-22 LAB — CULTURE, BLOOD (ROUTINE X 2)
Culture: NO GROWTH
Culture: NO GROWTH
SPECIAL REQUESTS: ADEQUATE
Special Requests: ADEQUATE

## 2017-11-29 ENCOUNTER — Ambulatory Visit (INDEPENDENT_AMBULATORY_CARE_PROVIDER_SITE_OTHER): Payer: Medicare Other | Admitting: Specialist

## 2017-12-16 ENCOUNTER — Other Ambulatory Visit: Payer: Self-pay

## 2017-12-16 ENCOUNTER — Emergency Department (HOSPITAL_COMMUNITY)
Admission: EM | Admit: 2017-12-16 | Discharge: 2017-12-17 | Disposition: A | Discharge status: Home / Self Care | Payer: Medicare Other | Attending: Emergency Medicine | Admitting: Emergency Medicine

## 2017-12-16 DIAGNOSIS — I10 Essential (primary) hypertension: Secondary | ICD-10-CM | POA: Insufficient documentation

## 2017-12-16 DIAGNOSIS — E119 Type 2 diabetes mellitus without complications: Secondary | ICD-10-CM | POA: Diagnosis not present

## 2017-12-16 DIAGNOSIS — N390 Urinary tract infection, site not specified: Secondary | ICD-10-CM | POA: Diagnosis not present

## 2017-12-16 DIAGNOSIS — R197 Diarrhea, unspecified: Secondary | ICD-10-CM

## 2017-12-16 DIAGNOSIS — Z79899 Other long term (current) drug therapy: Secondary | ICD-10-CM | POA: Insufficient documentation

## 2017-12-16 DIAGNOSIS — A0472 Enterocolitis due to Clostridium difficile, not specified as recurrent: Secondary | ICD-10-CM | POA: Diagnosis not present

## 2017-12-16 DIAGNOSIS — R109 Unspecified abdominal pain: Secondary | ICD-10-CM | POA: Diagnosis not present

## 2017-12-16 DIAGNOSIS — N183 Chronic kidney disease, stage 3 (moderate): Secondary | ICD-10-CM | POA: Diagnosis not present

## 2017-12-16 DIAGNOSIS — Z7982 Long term (current) use of aspirin: Secondary | ICD-10-CM | POA: Insufficient documentation

## 2017-12-16 DIAGNOSIS — N12 Tubulo-interstitial nephritis, not specified as acute or chronic: Secondary | ICD-10-CM

## 2017-12-16 LAB — COMPREHENSIVE METABOLIC PANEL
ALBUMIN: 3.3 g/dL — AB (ref 3.5–5.0)
ALK PHOS: 81 U/L (ref 38–126)
ALT: 24 U/L (ref 17–63)
ANION GAP: 9 (ref 5–15)
AST: 24 U/L (ref 15–41)
BILIRUBIN TOTAL: 0.5 mg/dL (ref 0.3–1.2)
BUN: 9 mg/dL (ref 6–20)
CALCIUM: 8.8 mg/dL — AB (ref 8.9–10.3)
CO2: 29 mmol/L (ref 22–32)
Chloride: 101 mmol/L (ref 101–111)
Creatinine, Ser: 1.92 mg/dL — ABNORMAL HIGH (ref 0.61–1.24)
GFR calc non Af Amer: 34 mL/min — ABNORMAL LOW (ref >60)
GFR, EST AFRICAN AMERICAN: 40 mL/min — AB (ref >60)
GLUCOSE: 139 mg/dL — AB (ref 65–99)
POTASSIUM: 3.5 mmol/L (ref 3.5–5.1)
Sodium: 139 mmol/L (ref 135–145)
TOTAL PROTEIN: 7.3 g/dL (ref 6.5–8.1)

## 2017-12-16 LAB — URINALYSIS, ROUTINE W REFLEX MICROSCOPIC
Bilirubin Urine: NEGATIVE
GLUCOSE, UA: NEGATIVE mg/dL
Ketones, ur: NEGATIVE mg/dL
NITRITE: POSITIVE — AB
PROTEIN: NEGATIVE mg/dL
Specific Gravity, Urine: 1.012 (ref 1.005–1.030)
pH: 6 (ref 5.0–8.0)

## 2017-12-16 LAB — CBC
HEMATOCRIT: 42.5 % (ref 39.0–52.0)
HEMOGLOBIN: 13.7 g/dL (ref 13.0–17.0)
MCH: 28.5 pg (ref 26.0–34.0)
MCHC: 32.2 g/dL (ref 30.0–36.0)
MCV: 88.5 fL (ref 78.0–100.0)
Platelets: 268 10*3/uL (ref 150–400)
RBC: 4.8 MIL/uL (ref 4.22–5.81)
RDW: 14.3 % (ref 11.5–15.5)
WBC: 7.4 10*3/uL (ref 4.0–10.5)

## 2017-12-16 LAB — LIPASE, BLOOD: Lipase: 62 U/L — ABNORMAL HIGH (ref 11–51)

## 2017-12-16 NOTE — ED Triage Notes (Signed)
Pt st's he has had diarrhea x's 2 weeks sometimes with bright red blood in it.  Pt st's his MD has done stool cultures but results are not back yet.  Pt st's he has continued to have diarrhea and weight loss also abd pain.

## 2017-12-16 NOTE — ED Provider Notes (Signed)
Patient placed in Quick Look pathway, seen and evaluated   Chief Complaint: dizziness, diarrhea  HPI:   Micheal Wood is a 67 y.o. who presents from his PCP office to the ED for evaluation of diarrhea that is bloody, weakness and weight loss. Patient reports weight loss of 12 pounds over the past 2 weeks since the diarrhea started. Patient's doctor sent stool for culture last week but results are not back yet. Patient reports he has had a colonoscopy about a year ago that was normal. Patient reports about 12 diarrhea stools in the past 24 hours. No one else at home is sick. Patient rates his pain as 4/10.   ROS: GI: diarrhea abdominal pain PE: BP 116/77 (BP Location: Right Arm)   Pulse 84   Temp 98.9 F (37.2 C) (Oral)   Resp 18   SpO2 100%    Gen: No distress  Neuro: Awake and Alert  Skin: Warm and dry  Abdomen: pain with palpation of the LLQ     Initiation of care has begun. The patient has been counseled on the process, plan, and necessity for staying for the completion/evaluation, and the remainder of the medical screening examination    Janne Napoleoneese, Vanden Fawaz M, NP 12/16/17 1800    Derwood KaplanNanavati, Ankit, MD 12/19/17 1408

## 2017-12-17 ENCOUNTER — Other Ambulatory Visit: Payer: Self-pay

## 2017-12-17 ENCOUNTER — Emergency Department (HOSPITAL_COMMUNITY): Payer: Medicare Other

## 2017-12-17 DIAGNOSIS — A0472 Enterocolitis due to Clostridium difficile, not specified as recurrent: Secondary | ICD-10-CM

## 2017-12-17 DIAGNOSIS — N183 Chronic kidney disease, stage 3 unspecified: Secondary | ICD-10-CM | POA: Insufficient documentation

## 2017-12-17 DIAGNOSIS — Z8249 Family history of ischemic heart disease and other diseases of the circulatory system: Secondary | ICD-10-CM

## 2017-12-17 DIAGNOSIS — Z809 Family history of malignant neoplasm, unspecified: Secondary | ICD-10-CM | POA: Diagnosis not present

## 2017-12-17 DIAGNOSIS — N390 Urinary tract infection, site not specified: Secondary | ICD-10-CM

## 2017-12-17 DIAGNOSIS — R197 Diarrhea, unspecified: Secondary | ICD-10-CM

## 2017-12-17 DIAGNOSIS — Z823 Family history of stroke: Secondary | ICD-10-CM | POA: Diagnosis not present

## 2017-12-17 LAB — GASTROINTESTINAL PANEL BY PCR, STOOL (REPLACES STOOL CULTURE)
ADENOVIRUS F40/41: NOT DETECTED
ASTROVIRUS: NOT DETECTED
CYCLOSPORA CAYETANENSIS: NOT DETECTED
Campylobacter species: NOT DETECTED
Cryptosporidium: NOT DETECTED
ENTEROPATHOGENIC E COLI (EPEC): NOT DETECTED
ENTEROTOXIGENIC E COLI (ETEC): NOT DETECTED
Entamoeba histolytica: NOT DETECTED
Enteroaggregative E coli (EAEC): NOT DETECTED
Giardia lamblia: NOT DETECTED
NOROVIRUS GI/GII: NOT DETECTED
Plesimonas shigelloides: NOT DETECTED
Rotavirus A: NOT DETECTED
Salmonella species: NOT DETECTED
Sapovirus (I, II, IV, and V): NOT DETECTED
Shiga like toxin producing E coli (STEC): NOT DETECTED
Shigella/Enteroinvasive E coli (EIEC): NOT DETECTED
VIBRIO SPECIES: NOT DETECTED
Vibrio cholerae: NOT DETECTED
Yersinia enterocolitica: NOT DETECTED

## 2017-12-17 LAB — I-STAT CG4 LACTIC ACID, ED
LACTIC ACID, VENOUS: 1.09 mmol/L (ref 0.5–1.9)
Lactic Acid, Venous: 1.04 mmol/L (ref 0.5–1.9)

## 2017-12-17 LAB — C DIFFICILE QUICK SCREEN W PCR REFLEX
C DIFFICILE (CDIFF) TOXIN: NEGATIVE
C DIFFICLE (CDIFF) ANTIGEN: POSITIVE — AB

## 2017-12-17 LAB — CLOSTRIDIUM DIFFICILE BY PCR, REFLEXED: CDIFFPCR: POSITIVE — AB

## 2017-12-17 LAB — CBG MONITORING, ED: GLUCOSE-CAPILLARY: 106 mg/dL — AB (ref 65–99)

## 2017-12-17 MED ORDER — VANCOMYCIN HCL 125 MG PO CAPS: 125.0000 mg | ORAL_CAPSULE | Freq: Four times a day (QID) | ORAL | 0 refills | Status: AC

## 2017-12-17 MED ORDER — VANCOMYCIN 50 MG/ML ORAL SOLUTION
125.0000 mg | Freq: Four times a day (QID) | ORAL | Status: DC
  Administered 2017-12-17: 125 mg via ORAL
  Filled 2017-12-17 (×3): qty 2.5

## 2017-12-17 MED ORDER — SODIUM CHLORIDE 0.9 % IV SOLN
1.0000 g | Freq: Once | INTRAVENOUS | Status: AC
  Administered 2017-12-17: 1 g via INTRAVENOUS
  Filled 2017-12-17: qty 1

## 2017-12-17 MED ORDER — SULFAMETHOXAZOLE-TRIMETHOPRIM 800-160 MG PO TABS: 1.0000 | ORAL_TABLET | Freq: Once | ORAL | Status: DC

## 2017-12-17 MED ORDER — SULFAMETHOXAZOLE-TRIMETHOPRIM 800-160 MG PO TABS: 1.0000 | ORAL_TABLET | Freq: Two times a day (BID) | ORAL | 0 refills | Status: AC

## 2017-12-17 MED ORDER — SODIUM CHLORIDE 0.9 % IV BOLUS
1000.0000 mL | Freq: Once | INTRAVENOUS | Status: AC
  Administered 2017-12-17: 1000 mL via INTRAVENOUS

## 2017-12-17 NOTE — Discharge Planning (Signed)
EDCM consulted in regards to medication assistance.  Pt has insurance coverage and is not eligible for MATCH program.    

## 2017-12-17 NOTE — ED Provider Notes (Signed)
  Physical Exam  BP 117/84 (BP Location: Right Arm)   Pulse 83   Temp 98.8 F (37.1 C) (Oral)   Resp 17   Ht 5\' 7"  (1.702 m)   Wt 78.9 kg (174 lb)   SpO2 99%   BMI 27.25 kg/m   Physical Exam  ED Course/Procedures     Procedures  MDM  Care assumed at 7 am. Patient has recent admission for UTI and has diarrhea, persistent flank pain. UA + UTI. CT showed possible pyelo. Afebrile in the ED. Patient had diarrhea so C diff sent. Sign out pending C diff results, consult to IM service.   11:35 AM Dr. Cyndie ChimeGranfortuna from IM saw patient. Recommend PO vanc for C diff (C diff antigen positive). No toxic megacolon on CT. Also given meropenem. Previous urine culture showed ESBL but sensitive to bactrim. They recommend PO vanc for 2 weeks and a week of bactrim. Will follow up in clinic       Charlynne PanderYao, Tahiry Spicer Hsienta, MD 12/17/17 1136

## 2017-12-17 NOTE — Progress Notes (Signed)
CSW acknowledges consult for medication assistance. For further assistance with this matter please consult RNCM. At this time there are no further CSW needs. CSW will sign off.   Claude MangesKierra S. Kashae Carstens, MSW, LCSW-A Emergency Department Clinical Social Worker [phone]

## 2017-12-17 NOTE — Care Management (Signed)
[facility] the [facility] 125 mg tablets.  EDCM confirmed with pt pharmacy ([facility] [address] and [facility]) that Rx was stocked and available today.

## 2017-12-17 NOTE — Consult Note (Addendum)
Date: 12/17/2017               Patient Name:  Micheal Wood MRN: [id number]  DOB: March 05, 1951 Age / Sex: 67 y.o., male   PCP: [facility]         Requesting Physician: Dr. Silverio Lay, Gonzella Lex, MD    Consulting Reason:  [facility] for treatment recommendation of suspected C. Difficile colitis and recurrent UTI     Chief Complaint: Diarrhea  History of Present Illness: Micheal Wood is a 67 y/o man with a history of diet controlled type 2 diabetes mellitus, hypertension, and chronic kidney disease stage 2 who was seen in the hospital by our service in May for pyelonephritis who returns due to 2 weeks of ongoing frequent watery diarrhea.  He is having from 5-10 episodes per day.  There is no significant associated nausea and vomiting.  He does have some abdominal pain and cramping.  Of note during these 2 weeks he is also redeveloped some mild dysuria although denies any frank burning or fevers associated with his infection last month.  He denies any new fevers at home. One month ago he was treated with 2 days of meropenem followed by 5 days of Bactrim.  This was preceded by several days of ciprofloxacin that had failed to treat his UTI.  He did feel that his symptoms improved initially after this treatment. Since arrival in the emergency department repeat abdominal CT scan showed persistence of bilateral perinephric stranding though improved compared to his May study.  He had no remarkable acute renal dysfunction or leukocytosis.  Urinalysis was suggestive of infection.  By the time of this encounter he had not provided a new stool sample for C. difficile antigen test.  Internal medicine was consulted for further evaluation and management recommendations on this patient recently on our service with concern for C. difficile colitis as well as recurrent urinary tract infection.  Meds: Current Facility-Administered Medications  Medication Dose Route Frequency Provider Last Rate Last Dose  .  meropenem (MERREM) 1 g in sodium chloride 0.9 % 100 mL IVPB  1 g Intravenous Once Charlynne Pander, MD      . vancomycin (VANCOCIN) 50 mg/mL oral solution 125 mg  125 mg Oral QID Charlynne Pander, MD       Current Outpatient Medications  Medication Sig Dispense Refill  . amLODipine (NORVASC) 10 MG tablet Take 5 mg by mouth daily.    Marland Kitchen aspirin EC 81 MG tablet Take 81 mg by mouth daily.    . B Complex Vitamins (VITAMIN-B COMPLEX) TABS Take 1 tablet by mouth daily.    . baclofen (LIORESAL) 10 MG tablet Take 1 tablet (10 mg total) by mouth 3 (three) times daily. (Patient taking differently: Take 10 mg by mouth 3 (three) times daily as needed for muscle spasms. ) 50 each 0  . Calcium Carbonate-Vitamin D 600-400 MG-UNIT tablet Take 2 tablets by mouth every other day.     . carboxymethylcellulose 1 % ophthalmic solution Place 1 drop into both eyes 3 (three) times daily.     . chlorthalidone (HYGROTON) 50 MG tablet Take 50 mg by mouth daily.    . Coenzyme Q10 (COQ10 PO) Take 1 capsule by mouth daily.    . fluticasone (FLONASE) 50 MCG/ACT nasal spray Place 1 spray into both nostrils at bedtime.    . gabapentin (NEURONTIN) 300 MG capsule Take 1 capsule (300 mg total) by mouth at bedtime. (Patient taking differently: Take 300 mg  by mouth at bedtime as needed (for tingling in hands/arms). ) 90 capsule 3  . Isopropyl Alcohol (SWIM EAR OT) Place 4-5 drops into both ears daily as needed (for clogged ears).     Marland Kitchen. ketoconazole (NIZORAL) 2 % shampoo Apply 1 application topically daily as needed (tinea versicolor on back).    Marland Kitchen. Lysine 1000 MG TABS Take 1,000 mg by mouth 3 (three) times a week.    . Menthol, Topical Analgesic, (ICY HOT EX) Apply 1 application topically daily as needed (pain).    . Multiple Vitamins-Minerals (EMERGEN-C VITAMIN C) PACK Take 1 packet by mouth daily.    Marland Kitchen. omeprazole (PRILOSEC) 20 MG capsule Take 20 mg by mouth 2 (two) times daily before a meal.     . ondansetron (ZOFRAN ODT) 4 MG  disintegrating tablet Take 1 tablet (4 mg total) by mouth every 8 (eight) hours as needed for nausea or vomiting. 20 tablet 0  . potassium chloride SA (K-DUR,KLOR-CON) 20 MEQ tablet Take 20 mEq by mouth daily.    . sildenafil (VIAGRA) 100 MG tablet Take 100 mg by mouth daily as needed for erectile dysfunction.     . THERATEARS 1 % GEL Place 1 drop into both eyes daily as needed (for dryness).     . Undecylenic Acid (FUNGI-NAIL EX) Apply 1 application topically See admin instructions. One application to affected nails daily as needed for fungus    . vitamin E 400 UNIT capsule Take 400 Units by mouth 3 (three) times a week.     . zinc gluconate 50 MG tablet Take 50 mg by mouth every other day.      Allergies: Allergies as of 12/16/2017 - Review Complete 11/17/2017  Allergen Reaction Noted  . Contrast media [iodinated diagnostic agents] Other (See Comments) 08/27/2011  . Fosinopril Swelling 10/30/2011  . Ioxaglate Other (See Comments) 08/27/2011  . Lisinopril Other (See Comments) 08/27/2011  . Adhesive [tape] Rash 11/17/2017   Past Medical History:  Diagnosis Date  . Arthritis    DDD  . Diabetes mellitus without complication (HCC)   . GERD (gastroesophageal reflux disease)   . Hypertension   . Narrowing of intervertebral disc space   . Sleep apnea    CPAP   Past Surgical History:  Procedure Laterality Date  . u     removal of cysts x 3, chest x 2, wrists  . uvula removed     Family History  Problem Relation Age of Onset  . Hypertension Mother   . Ulcers Mother   . Hypertension Brother   . Cancer Brother   . Cancer Sister   . Stroke Paternal Grandmother    Social History   Socioeconomic History  . Marital status: Legally Separated    Spouse name: Not on file  . Number of children: 3  . Years of education: 4314  . Highest education level: Not on file  Occupational History  . Occupation: retired    Comment: [employer], 20+ years  Tobacco Use  . Smoking status: Never  Smoker  . Smokeless tobacco: Never Used  Substance and Sexual Activity  . Alcohol use: No  . Drug use: No  . Sexual activity: Not on file    Review of Systems: Review of Systems  Constitutional: Positive for chills and malaise/fatigue. Negative for fever.  HENT: Negative for sore throat.   Eyes: Negative for blurred vision.  Respiratory: Negative for shortness of breath.   Cardiovascular: Negative for palpitations.  Gastrointestinal: Positive for abdominal  pain and diarrhea. Negative for nausea and vomiting.  Genitourinary: Positive for dysuria. Negative for flank pain.  Musculoskeletal: Negative for myalgias.  Skin: Negative for rash.  Neurological: Negative for weakness.  Psychiatric/Behavioral: Negative for substance abuse.     Physical Exam: Blood pressure 127/87, pulse 94, temperature 98 F (36.7 C), temperature source Oral, resp. rate 20, height 5\' 7"  (1.702 m), weight 174 lb (78.9 kg), SpO2 100 %. GENERAL- alert, co-operative, NAD HEENT- Atraumatic, no scleral icterus, oral mucosa appears moist, good and intact dentition CARDIAC- RRR, no murmurs, rubs or gallops. RESP- CTAB, no wheezes or crackles. ABDOMEN- tenderness to palpation worst over left side and flank, hyperactive bowel sounds appreciated in upper quadrants, no guarding or rebound pain BACK- No CVA tenderness. EXTREMITIES- symmetric, no pedal edema. SKIN- Warm, dry, No rash or lesion. PSYCH- Normal mood and affect, appropriate thought content and speech.   Lab results: Basic Metabolic Panel: Recent Labs    12/16/17 1811  NA 139  K 3.5  CL 101  CO2 29  GLUCOSE 139*  BUN 9  CREATININE 1.92*  CALCIUM 8.8*   Liver Function Tests: Recent Labs    12/16/17 1811  AST 24  ALT 24  ALKPHOS 81  BILITOT 0.5  PROT 7.3  ALBUMIN 3.3*   Recent Labs    12/16/17 1811  LIPASE 62*   CBC: Recent Labs    12/16/17 1811  WBC 7.4  HGB 13.7  HCT 42.5  MCV 88.5  PLT 268   CBG: Recent Labs     12/17/17 0041  GLUCAP 106*   Urinalysis: Recent Labs    12/16/17 1802  COLORURINE YELLOW  LABSPEC 1.012  PHURINE 6.0  GLUCOSEU NEGATIVE  HGBUR SMALL*  BILIRUBINUR NEGATIVE  KETONESUR NEGATIVE  PROTEINUR NEGATIVE  NITRITE POSITIVE*  LEUKOCYTESUR LARGE*    Imaging results:  Result Date: 12/17/2017 EXAM: CT ABDOMEN AND PELVIS WITHOUT CONTRAST IMPRESSION: 1. Improved with mild persistent bilateral perinephric stranding from prior exam, of unknown etiology. No urolithiasis or hydronephrosis. 2. No explanation for diarrhea. Minimal colonic diverticulosis without diverticulitis. 3. 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 difficile colitis as a cause of diarrhea given the timing about 3 weeks from antibiotics to symptom onset. The ongoing frequent watery diarrhea for 2-week duration is longer than would generally be expected for a simple food poisoning or acute gastroenteritis.  Ciprofloxacin, meropenem, and to a lesser extent Bactrim can certainly be associated with this.  He does not have any fever or significant leukocytosis to suggest a severe infection. Plan: -Recommend starting treatment with oral vancomycin 125mg  q6hrs -Treatment duration should be increased to 14 days for a 1 week tail of coverage beyond other concurrent antibiotics to reduce the risk of treatment failure/recurrence (references below)  "Efficacy of Secondary Prophylaxis With Vancomycin for Preventing Recurrent Clostridium difficile Infections." Am J Gastroenterol. 2016;111(12):1834  "Efficacy of fidaxomicin versus vancomycin as therapy for Clostridium difficile infection in individuals taking concomitant antibiotics for other concurrent infections." Clin Infect Dis. 2011;53(5):440.  Recurrent Urinary Tract Infection His urinalysis and symptoms are certainly consistent with symptomatic UTI at this time.  Is  unclear why he has persistent improving perinephric stranding on the CT scan a month after treatment for previous pyelonephritis. This raises some question of possible treatment failure despite the interval improvement in symptoms.  The previous organism isolated was ESBL with susceptibility to meropenem, Zosyn, nitrofurantoin, and Bactrim.  Of  these only Bactrim is an appropriate oral antibiotic choice for this patient. Plan: -Agree with planned one-time initial parenteral dose of meropenem -Recommend discharge on sulfamethoxazole-trimethoprim 800-160mg  BID x 7 days   He is in no acute distress and appears to be maintaining adequate hydration on outpatient basis so would be safe to continue his treatment at home.  Signed: Fuller Plan, MD PGY-III Internal Medicine Resident Pager# [phone] 12/17/2017, 8:05 AM

## 2017-12-17 NOTE — Discharge Instructions (Signed)
Take vanc 125 mg four times daily for 2 weeks.   Take bactrim twice daily for a week.   Stay hydrated   Return to ER if you have severe abdominal pain, vomiting, fever, flank pain

## 2017-12-17 NOTE — ED Provider Notes (Signed)
[facility] EMERGENCY DEPARTMENT Provider Note   CSN: [id number] Arrival date & time: 12/16/17  1652     History   Chief Complaint Chief Complaint  Patient presents with  . Diarrhea    HPI Micheal Wood is a 67 y.o. male.  Patient presents with 2-week history of diarrhea 5-10 times daily intermittently bloody.  States he gave a stool sample to his PCPs office on [address] but does not know the results yet.  Reports 12 pound weight loss.  Denies fever, chills, nausea or vomiting.  Has had some intermittent dizziness and lightheadedness.  Patient was admitted in May with pyelonephritis and was on broad-spectrum antibiotics for multidrug-resistant E. coli.  He finished a course of Bactrim several weeks ago.  He came in today because he feels dizzy he has frequent diarrhea and worsening abdominal pain.  Denies any recent travel.  No other sick contacts.  The history is provided by the patient.  Diarrhea   Associated symptoms include abdominal pain. Pertinent negatives include no vomiting, no headaches, no arthralgias, no myalgias and no cough.    Past Medical History:  Diagnosis Date  . Arthritis    DDD  . Diabetes mellitus without complication (HCC)   . GERD (gastroesophageal reflux disease)   . Hypertension   . Narrowing of intervertebral disc space   . Sleep apnea    CPAP    Patient Active Problem List   Diagnosis Date Noted  . Pyelonephritis 11/17/2017    Past Surgical History:  Procedure Laterality Date  . u     removal of cysts x 3, chest x 2, wrists  . uvula removed          Home Medications    Prior to Admission medications   Medication Sig Start Date End Date Taking? Authorizing Provider  amLODipine (NORVASC) 10 MG tablet Take 5 mg by mouth daily.    [provider]  aspirin EC 81 MG tablet Take 81 mg by mouth daily.    [provider]  B Complex Vitamins (VITAMIN-B COMPLEX) TABS Take 1 tablet by mouth daily.     [provider]  baclofen (LIORESAL) 10 MG tablet Take 1 tablet (10 mg total) by mouth 3 (three) times daily. Patient taking differently: Take 10 mg by mouth 3 (three) times daily as needed for muscle spasms.  10/30/17   Kerrin Champagne, MD  Calcium Carbonate-Vitamin D 600-400 MG-UNIT tablet Take 2 tablets by mouth every other day.  10/18/17   [provider]  carboxymethylcellulose 1 % ophthalmic solution Place 1 drop into both eyes 3 (three) times daily.     [provider]  chlorthalidone (HYGROTON) 50 MG tablet Take 50 mg by mouth daily.    [provider]  Coenzyme Q10 (COQ10 PO) Take 1 capsule by mouth daily.    [provider]  fluticasone (FLONASE) 50 MCG/ACT nasal spray Place 1 spray into both nostrils at bedtime.    [provider]  gabapentin (NEURONTIN) 300 MG capsule Take 1 capsule (300 mg total) by mouth at bedtime. Patient taking differently: Take 300 mg by mouth at bedtime as needed (for tingling in hands/arms).  10/30/17   Kerrin Champagne, MD  Isopropyl Alcohol (SWIM EAR OT) Place 4-5 drops into both ears daily as needed (for clogged ears).     [provider]  ketoconazole (NIZORAL) 2 % shampoo Apply 1 application topically daily as needed (tinea versicolor on back).    [provider]  Lysine 1000 MG TABS Take 1,000 mg by mouth 3 (three) times a week.    [provider]  Menthol, Topical Analgesic, (ICY HOT EX) Apply 1 application topically daily as needed (pain).    [provider]  Multiple Vitamins-Minerals (EMERGEN-C VITAMIN C) PACK Take 1 packet by mouth daily.    [provider]  omeprazole (PRILOSEC) 20 MG capsule Take 20 mg by mouth 2 (two) times daily before a meal.     [provider]  ondansetron (ZOFRAN ODT) 4 MG disintegrating tablet Take 1 tablet (4 mg total) by mouth every 8 (eight) hours as needed for nausea or vomiting. 10/05/17   Mathews RobinsonsMitchell, Jessica B, PA-C    potassium chloride SA (K-DUR,KLOR-CON) 20 MEQ tablet Take 20 mEq by mouth daily.    [provider]  sildenafil (VIAGRA) 100 MG tablet Take 100 mg by mouth daily as needed for erectile dysfunction.     [provider]  THERATEARS 1 % GEL Place 1 drop into both eyes daily as needed (for dryness).  10/15/17   [provider]  Undecylenic Acid (FUNGI-NAIL EX) Apply 1 application topically See admin instructions. One application to affected nails daily as needed for fungus    [provider]  vitamin E 400 UNIT capsule Take 400 Units by mouth 3 (three) times a week.     [provider]  zinc gluconate 50 MG tablet Take 50 mg by mouth every other day.    [provider]    Family History Family History  Problem Relation Age of Onset  . Hypertension Mother   . Ulcers Mother   . Hypertension Brother   . Cancer Brother   . Cancer Sister   . Stroke Paternal Grandmother     Social History Social History   Tobacco Use  . Smoking status: Never Smoker  . Smokeless tobacco: Never Used  Substance Use Topics  . Alcohol use: No  . Drug use: No     Allergies   Contrast media [iodinated diagnostic agents]; Fosinopril; Ioxaglate; Lisinopril; and Adhesive [tape]   Review of Systems Review of Systems  Constitutional: Positive for activity change, appetite change and fatigue. Negative for fever.  HENT: Negative for congestion and rhinorrhea.   Respiratory: Negative for cough, chest tightness and shortness of breath.   Cardiovascular: Negative for chest pain.  Gastrointestinal: Positive for abdominal pain and diarrhea. Negative for nausea and vomiting.  Genitourinary: Negative for dysuria, hematuria and testicular pain.  Musculoskeletal: Negative for arthralgias, back pain and myalgias.  Skin: Negative for rash.  Neurological: Positive for weakness and light-headedness. Negative for dizziness and headaches.   all other systems are negative  except as noted in the HPI and PMH.     Physical Exam Updated Vital Signs BP (!) 131/91 (BP Location: Right Arm)   Pulse 75   Temp 98 F (36.7 C) (Oral)   Resp 17   Ht 5\' 7"  (1.702 m)   Wt 78.9 kg (174 lb)   SpO2 100%   BMI 27.25 kg/m   Physical Exam  Constitutional: He is oriented to person, place, and time. He appears well-developed and well-nourished. No distress.  HENT:  Head: Normocephalic and atraumatic.  Mouth/Throat: Oropharynx is clear and moist. No oropharyngeal exudate.  Eyes: Pupils are equal, round, and reactive to light. Conjunctivae and EOM are normal.  Neck: Normal range of motion. Neck supple.  No meningismus.  Cardiovascular: Normal rate, regular rhythm, normal heart sounds and intact  distal pulses.  No murmur heard. Pulmonary/Chest: Effort normal and breath sounds normal. No respiratory distress.  Abdominal: Soft. There is tenderness. There is no rebound and no guarding.  Mild diffuse tenderness, no guarding or rebound  Musculoskeletal: Normal range of motion. He exhibits no edema or tenderness.  No CVA tenderness  Neurological: He is alert and oriented to person, place, and time. No cranial nerve deficit. He exhibits normal muscle tone. Coordination normal.  No ataxia on finger to nose bilaterally. No pronator drift. 5/5 strength throughout. CN 2-12 intact.Equal grip strength. Sensation intact.   Skin: Skin is warm.  Psychiatric: He has a normal mood and affect. His behavior is normal.  Nursing note and vitals reviewed.    ED Treatments / Results  Labs (all labs ordered are listed, but only abnormal results are displayed) Labs Reviewed  LIPASE, BLOOD - Abnormal; Notable for the following components:      Result Value   Lipase 62 (*)    All other components within normal limits  COMPREHENSIVE METABOLIC PANEL - Abnormal; Notable for the following components:   Glucose, Bld 139 (*)    Creatinine, Ser 1.92 (*)    Calcium 8.8 (*)    Albumin 3.3 (*)      GFR calc non Af Amer 34 (*)    GFR calc Af Amer 40 (*)    All other components within normal limits  URINALYSIS, ROUTINE W REFLEX MICROSCOPIC - Abnormal; Notable for the following components:   APPearance HAZY (*)    Hgb urine dipstick SMALL (*)    Nitrite POSITIVE (*)    Leukocytes, UA LARGE (*)    WBC, UA >50 (*)    Bacteria, UA MANY (*)    All other components within normal limits  CBG MONITORING, ED - Abnormal; Notable for the following components:   Glucose-Capillary 106 (*)    All other components within normal limits  URINE CULTURE  C DIFFICILE QUICK SCREEN W PCR REFLEX  GASTROINTESTINAL PANEL BY PCR, STOOL (REPLACES STOOL CULTURE)  CULTURE, BLOOD (ROUTINE X 2)  CULTURE, BLOOD (ROUTINE X 2)  CBC  I-STAT CG4 LACTIC ACID, ED  I-STAT CG4 LACTIC ACID, ED    EKG None  Radiology Ct Renal Stone Study  Result Date: 12/17/2017 CLINICAL DATA:  Abdominal pain.  Diarrhea and weight loss. EXAM: CT ABDOMEN AND PELVIS WITHOUT CONTRAST TECHNIQUE: Multidetector CT imaging of the abdomen and pelvis was performed following the standard protocol without IV contrast. COMPARISON:  Noncontrast CT 11/27/2017 FINDINGS: Lower chest: The lung bases are clear. Hepatobiliary: Borderline hepatic steatosis. No discrete focal lesion. Gallbladder physiologically distended, no calcified stone. No biliary dilatation. Pancreas: No ductal dilatation or inflammation. Spleen: Normal in size without focal abnormality. Adrenals/Urinary Tract: No adrenal nodule. No hydronephrosis. Bilateral perinephric edema has diminished from prior exam. Mild residual stranding persists. No renal, ureteral, or bladder calculi. Both ureters are decompressed. Urinary bladder is partially distended. No bladder wall thickening. Stomach/Bowel: Lack of enteric contrast limits bowel assessment. Stomach is nondistended. No small bowel wall thickening, inflammatory change or obstruction. The appendix is elongated with tip coursing into the  midline of the abdomen but air-filled without Peri appendiceal inflammation or evidence of appendicitis. Liquid and solid stool in the ascending colon, with formed stool throughout the remainder the colon. No colonic wall thickening. Mild diverticulosis of the sigmoid colon without diverticulitis. Sigmoid colon is nondistended. Vascular/Lymphatic: Prominent central mesenteric nodes, all subcentimeter in short axis. No retroperitoneal or pelvic adenopathy. Normal caliber abdominal aorta.  Reproductive: Prostate is unremarkable. Other: Minimal fat in the right inguinal canal. Tiny fat containing umbilical hernia. No free air, free fluid, or intra-abdominal fluid collection. Musculoskeletal: There are no acute or suspicious osseous abnormalities. Large anterior osteophytes at L3, L4 and L5. Transitional lumbosacral anatomy. IMPRESSION: 1. Improved with mild persistent bilateral perinephric stranding from prior exam, of unknown etiology. No urolithiasis or hydronephrosis. 2. No explanation for diarrhea. Minimal colonic diverticulosis without diverticulitis. 3. Mild hepatic steatosis. Electronically Signed   By: Rubye Oaks M.D.   On: 12/17/2017 05:49    Procedures Procedures (including critical care time)  Medications Ordered in ED Medications  sodium chloride 0.9 % bolus 1,000 mL (has no administration in time range)     Initial Impression / Assessment and Plan / ED Course  I have reviewed the triage vital signs and the nursing notes.  Pertinent labs & imaging results that were available during my care of the patient were reviewed by me and considered in my medical decision making (see chart for details).    Patient with 2 weeks of diarrhea, intermittently bloody with recent antibiotic use.  He appears well and nontoxic.  No fever.  Abdomen soft without peritoneal signs.  Creatinine elevated but improved from previous.  Will gently hydrate Request a stool sample from patient to evaluate for C.  Difficile.  Patient hydrated.  Unable to give stool sample.  His vitals are stable.  Urinalysis is again concerning for infection and culture is sent.  CT scan shows improving inflammation of kidneys and no ureteral stones.  No explanation for diarrhea.  Suspicion for C. difficile is high given recent antibiotic use.  However patient also appears to have urinary tract infection with symptoms.  Blood and urine culture sent again.  Consider empiric Flagyl but feel patient will also need to be given antibiotics for his UTI.  Discussed with internal medicine residents who admitted patient last month. They will evaluate patient in the ED and help determine disposition plan. Final Clinical Impressions(s) / ED Diagnoses   Final diagnoses:  None    ED Discharge Orders    None       Gresham Caetano, Jeannett Senior, MD 12/17/17 [phone]

## 2017-12-18 LAB — URINE CULTURE: Culture: <10000 — AB

## 2017-12-22 LAB — CULTURE, BLOOD (ROUTINE X 2)
Culture: NO GROWTH
Culture: NO GROWTH
Special Requests: ADEQUATE

## 2019-03-30 ENCOUNTER — Telehealth: Payer: Self-pay | Admitting: Specialist

## 2019-03-30 NOTE — Telephone Encounter (Signed)
Received vm from patient wanting copy of records as he has moved to [facility]. IC him back and lmvm. I advised him need to sign release form, told him we can mail or email form to him, asked him to call me back with his address

## 2019-03-31 NOTE — Telephone Encounter (Signed)
Pt called back and left his address on my vm, I mailed release form to him at [address] Marianne Sofia [id number]

## 2019-04-26 IMAGING — CT CT RENAL STONE PROTOCOL
2 of 4 series · 16 of 46 positions shown, 18 images · non-contrast
Comparison: Noncontrast CT 11/27/2017

CLINICAL DATA: Abdominal pain.  Diarrhea and weight loss.

EXAM:
CT ABDOMEN AND PELVIS WITHOUT CONTRAST
TECHNIQUE: Multidetector CT imaging of the abdomen and pelvis was performed
following the standard protocol without IV contrast.

[Series 3: ap without · axial · non-contrast · 0.70mm/px · z∈[+875,+1265]mm · 13 of 88 slices shown, 15 images]
[im 5/88  soft-tissue]
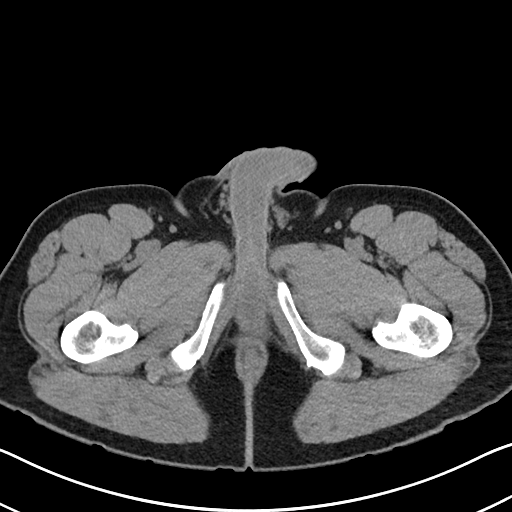
[im 5/88  bone]
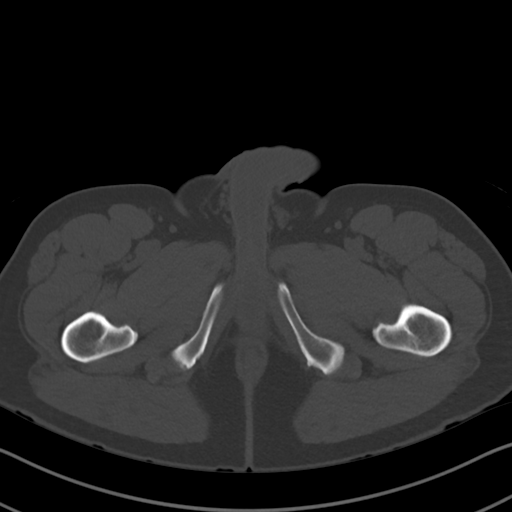
[im 13/88  soft-tissue]
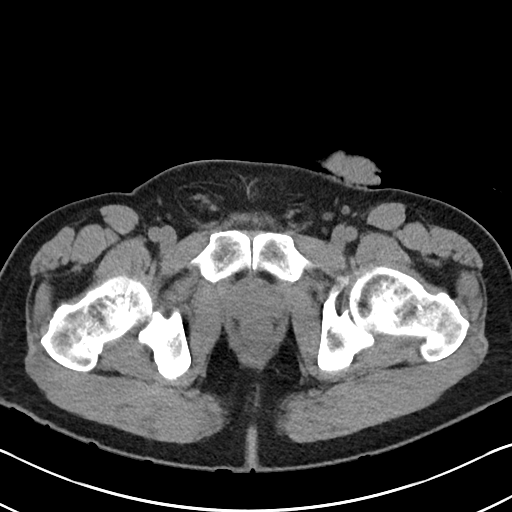
[im 17/88  soft-tissue]
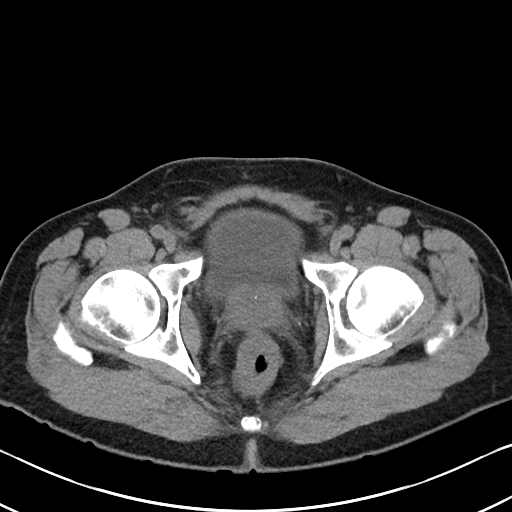
[im 25/88  soft-tissue]
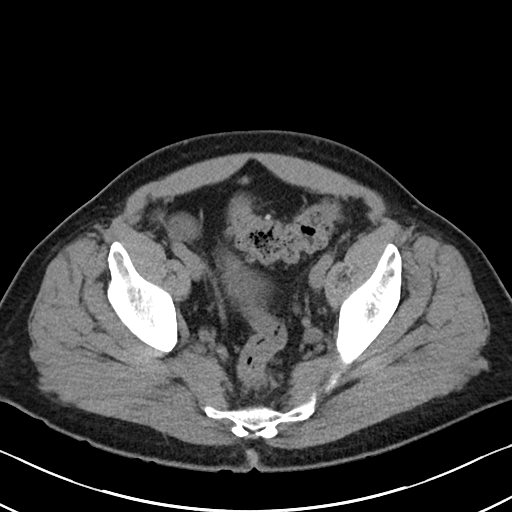
[im 30/88  soft-tissue]
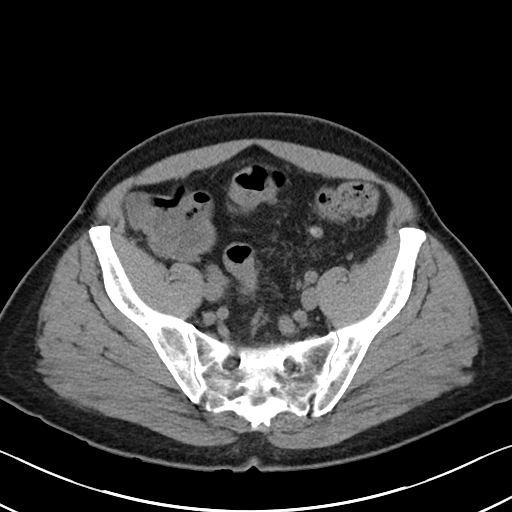
[im 38/88  soft-tissue]
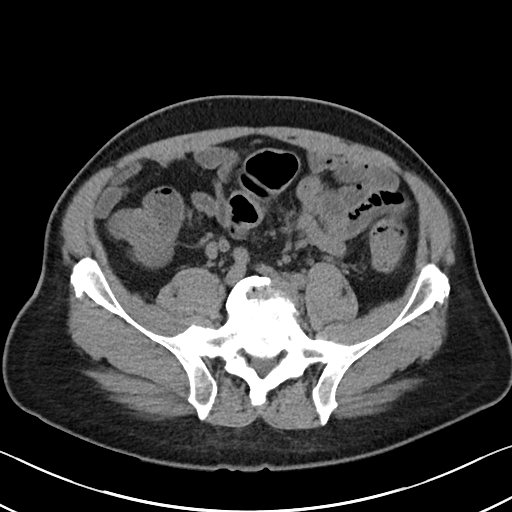
[im 46/88  soft-tissue]
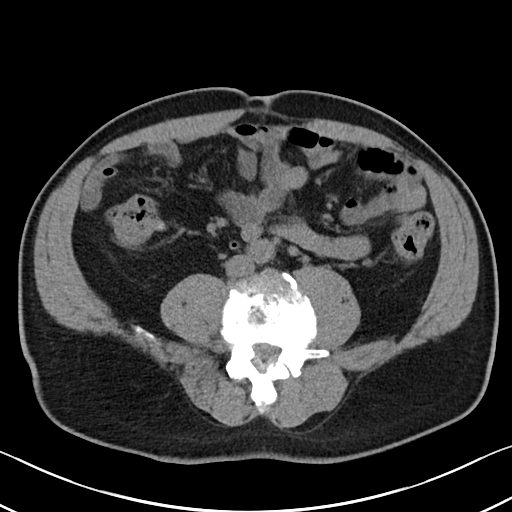
[im 50/88  soft-tissue]
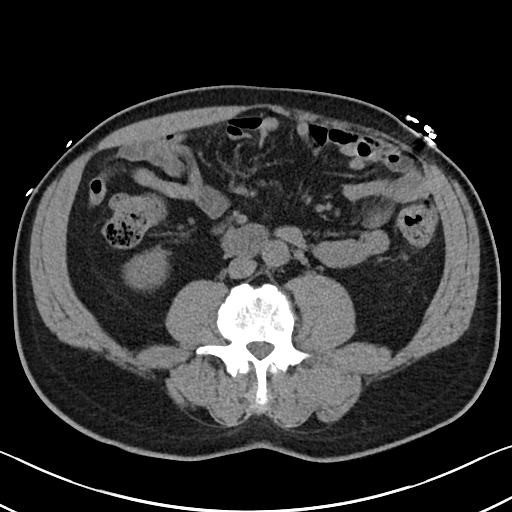
[im 59/88  soft-tissue]
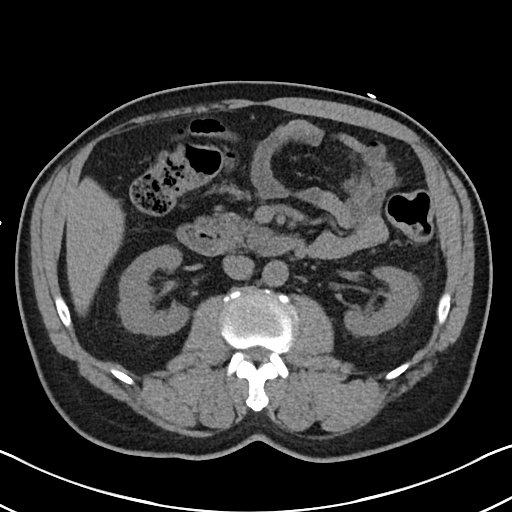
[im 59/88  bone]
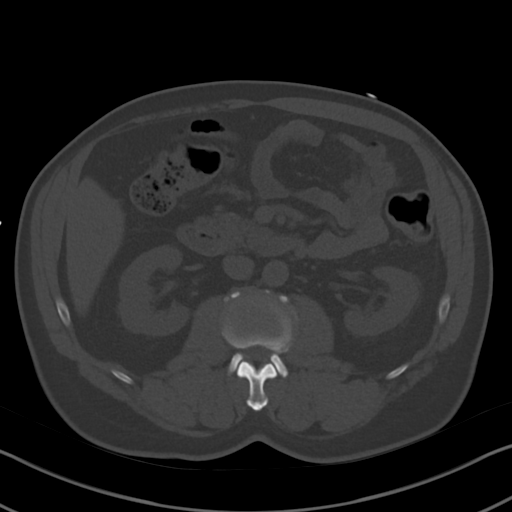
[im 63/88  soft-tissue]
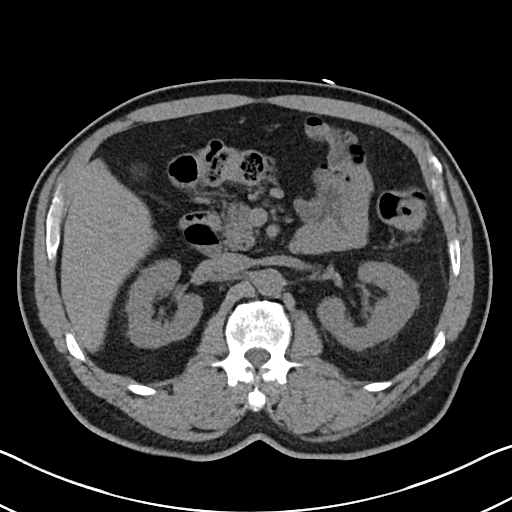
[im 71/88  soft-tissue]
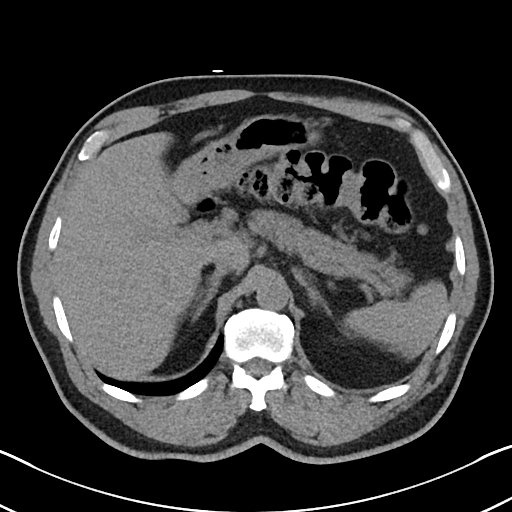
[im 75/88  soft-tissue]
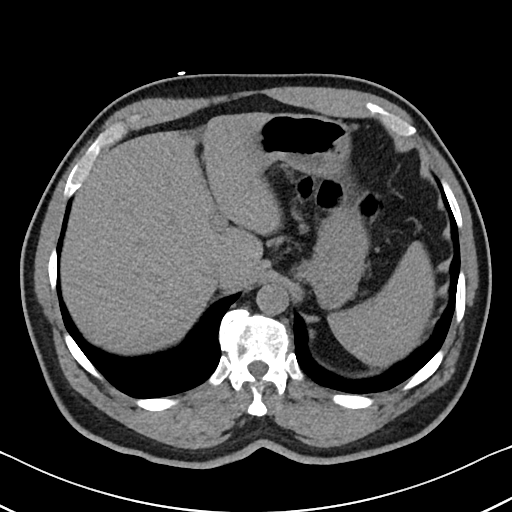
[im 83/88  soft-tissue]
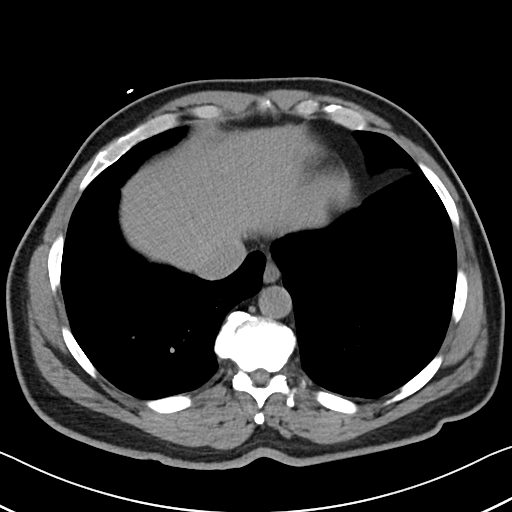

[Series 6: cor · coronal · 0.74mm/px · 3 of 89 slices shown]
[im 30/89  soft-tissue]
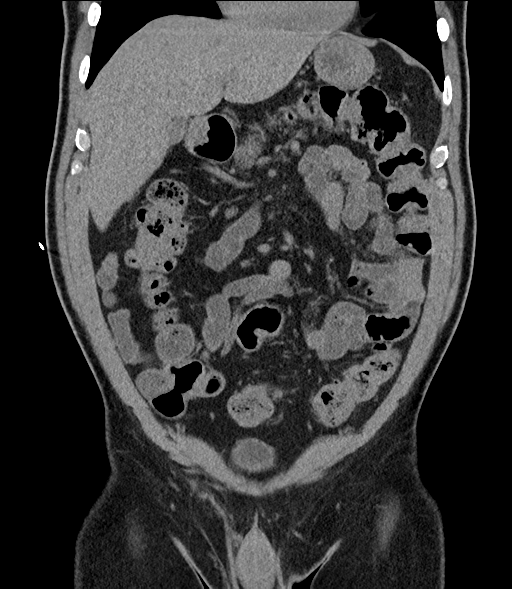
[im 40/89  soft-tissue]
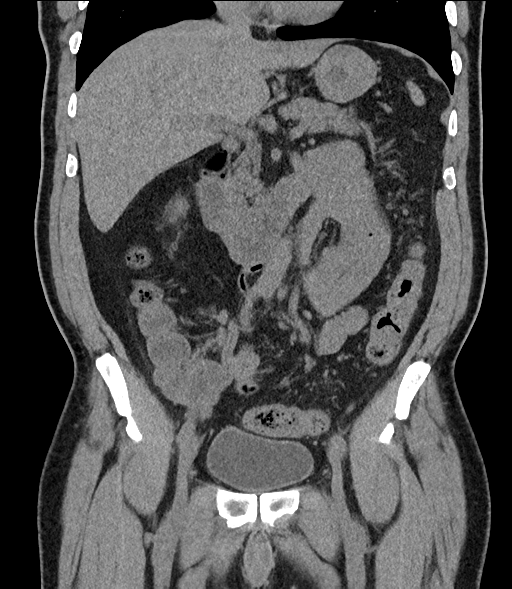
[im 49/89  soft-tissue]
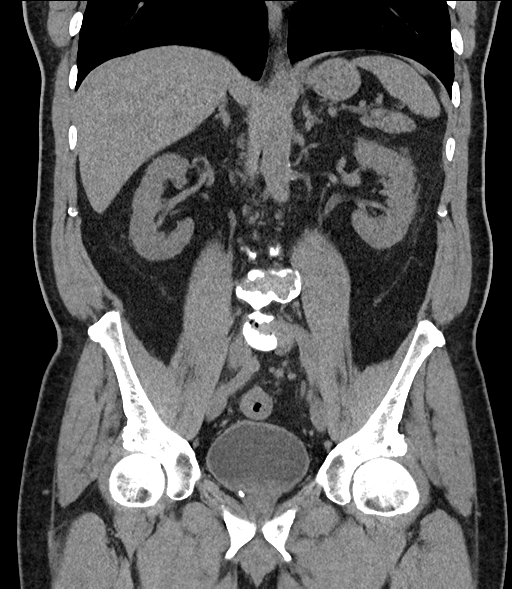

[16 of 46 positions shown; findings below may reference images not displayed]

FINDINGS: Lower chest: The lung bases are clear.

Hepatobiliary: Borderline hepatic steatosis. No discrete focal
lesion. Gallbladder physiologically distended, no calcified stone.
No biliary dilatation.

Pancreas: No ductal dilatation or inflammation.

Spleen: Normal in size without focal abnormality.

Adrenals/Urinary Tract: No adrenal nodule. No hydronephrosis.
Bilateral perinephric edema has diminished from prior exam. Mild
residual stranding persists. No renal, ureteral, or bladder calculi.
Both ureters are decompressed. Urinary bladder is partially
distended. No bladder wall thickening.

Stomach/Bowel: Lack of enteric contrast limits bowel assessment.
Stomach is nondistended. No small bowel wall thickening,
inflammatory change or obstruction. The appendix is elongated with
tip coursing into the midline of the abdomen but air-filled without
Peri appendiceal inflammation or evidence of appendicitis. Liquid
and solid stool in the ascending colon, with formed stool throughout
the remainder the colon. No colonic wall thickening. Mild
diverticulosis of the sigmoid colon without diverticulitis. Sigmoid
colon is nondistended.

Vascular/Lymphatic: Prominent central mesenteric nodes, all
subcentimeter in short axis. No retroperitoneal or pelvic
adenopathy. Normal caliber abdominal aorta.

Reproductive: Prostate is unremarkable.

Other: Minimal fat in the right inguinal canal. Tiny fat containing
umbilical hernia. No free air, free fluid, or intra-abdominal fluid
collection.

Musculoskeletal: There are no acute or suspicious osseous
abnormalities. Large anterior osteophytes at L3, L4 and L5.
Transitional lumbosacral anatomy.
IMPRESSION: 1. Improved with mild persistent bilateral perinephric stranding
from prior exam, of unknown etiology. No urolithiasis or
hydronephrosis.
2. No explanation for diarrhea. Minimal colonic diverticulosis
without diverticulitis.
3. Mild hepatic steatosis.
